# Patient Record
Sex: Male | Born: 1975 | Race: White | Hispanic: No | Marital: Married | Smoking: Former smoker
Health system: Southern US, Community
[De-identification: ages and names within clinical notes are randomized; demographics above are authoritative.]

## PROBLEM LIST (undated history)

## (undated) DIAGNOSIS — N289 Disorder of kidney and ureter, unspecified: Secondary | ICD-10-CM

## (undated) DIAGNOSIS — K259 Gastric ulcer, unspecified as acute or chronic, without hemorrhage or perforation: Secondary | ICD-10-CM

## (undated) DIAGNOSIS — K219 Gastro-esophageal reflux disease without esophagitis: Secondary | ICD-10-CM

## (undated) DIAGNOSIS — Z87442 Personal history of urinary calculi: Secondary | ICD-10-CM

## (undated) DIAGNOSIS — N200 Calculus of kidney: Secondary | ICD-10-CM

## (undated) HISTORY — DX: Calculus of kidney: N20.0

## (undated) HISTORY — PX: APPENDECTOMY: SHX54

## (undated) HISTORY — PX: OTHER SURGICAL HISTORY: SHX169

## (undated) HISTORY — PX: CYSTOSCOPY W/ URETERAL STENT PLACEMENT: SHX1429

## (undated) HISTORY — PX: SHOULDER SURGERY: SHX246

---

## 2006-07-21 HISTORY — PX: SHOULDER SURGERY: SHX246

## 2020-01-02 ENCOUNTER — Emergency Department (HOSPITAL_COMMUNITY): Payer: Self-pay

## 2020-01-02 ENCOUNTER — Encounter (HOSPITAL_COMMUNITY): Payer: Self-pay | Admitting: *Deleted

## 2020-01-02 ENCOUNTER — Emergency Department (HOSPITAL_COMMUNITY)
Admission: EM | Admit: 2020-01-02 | Discharge: 2020-01-02 | Disposition: A | Discharge status: Home / Self Care | Payer: Self-pay | Attending: Emergency Medicine | Admitting: Emergency Medicine

## 2020-01-02 ENCOUNTER — Other Ambulatory Visit: Payer: Self-pay

## 2020-01-02 DIAGNOSIS — R197 Diarrhea, unspecified: Secondary | ICD-10-CM | POA: Insufficient documentation

## 2020-01-02 DIAGNOSIS — R1011 Right upper quadrant pain: Secondary | ICD-10-CM

## 2020-01-02 DIAGNOSIS — R10816 Epigastric abdominal tenderness: Secondary | ICD-10-CM | POA: Insufficient documentation

## 2020-01-02 DIAGNOSIS — R10811 Right upper quadrant abdominal tenderness: Secondary | ICD-10-CM | POA: Insufficient documentation

## 2020-01-02 DIAGNOSIS — R112 Nausea with vomiting, unspecified: Secondary | ICD-10-CM | POA: Insufficient documentation

## 2020-01-02 LAB — CBC WITH DIFFERENTIAL/PLATELET
Abs Immature Granulocytes: 0.01 10*3/uL (ref 0.00–0.07)
Basophils Absolute: 0.1 10*3/uL (ref 0.0–0.1)
Basophils Relative: 1 %
Eosinophils Absolute: 0.5 10*3/uL (ref 0.0–0.5)
Eosinophils Relative: 8 %
HCT: 48.3 % (ref 39.0–52.0)
Hemoglobin: 16.7 g/dL (ref 13.0–17.0)
Immature Granulocytes: 0 %
Lymphocytes Relative: 30 %
Lymphs Abs: 1.7 10*3/uL (ref 0.7–4.0)
MCH: 29.8 pg (ref 26.0–34.0)
MCHC: 34.6 g/dL (ref 30.0–36.0)
MCV: 86.3 fL (ref 80.0–100.0)
Monocytes Absolute: 0.5 10*3/uL (ref 0.1–1.0)
Monocytes Relative: 8 %
Neutro Abs: 3 10*3/uL (ref 1.7–7.7)
Neutrophils Relative %: 53 %
Platelets: 203 10*3/uL (ref 150–400)
RBC: 5.6 MIL/uL (ref 4.22–5.81)
RDW: 12 % (ref 11.5–15.5)
WBC: 5.7 10*3/uL (ref 4.0–10.5)
nRBC: 0 % (ref 0.0–0.2)

## 2020-01-02 LAB — COMPREHENSIVE METABOLIC PANEL
ALT: 23 U/L (ref 0–44)
AST: 25 U/L (ref 15–41)
Albumin: 4.3 g/dL (ref 3.5–5.0)
Alkaline Phosphatase: 68 U/L (ref 38–126)
Anion gap: 11 (ref 5–15)
BUN: 12 mg/dL (ref 6–20)
CO2: 23 mmol/L (ref 22–32)
Calcium: 9.3 mg/dL (ref 8.9–10.3)
Chloride: 104 mmol/L (ref 98–111)
Creatinine, Ser: 0.94 mg/dL (ref 0.61–1.24)
GFR calc Af Amer: >60 mL/min (ref >60)
GFR calc non Af Amer: >60 mL/min (ref >60)
Glucose, Bld: 145 mg/dL — ABNORMAL HIGH (ref 70–99)
Potassium: 3.9 mmol/L (ref 3.5–5.1)
Sodium: 138 mmol/L (ref 135–145)
Total Bilirubin: 0.5 mg/dL (ref 0.3–1.2)
Total Protein: 7.4 g/dL (ref 6.5–8.1)

## 2020-01-02 LAB — LIPASE, BLOOD: Lipase: 32 U/L (ref 11–51)

## 2020-01-02 MED ORDER — ONDANSETRON HCL 4 MG/2ML IJ SOLN
4.0000 mg | Freq: Once | INTRAMUSCULAR | Status: AC
  Administered 2020-01-02: 4 mg via INTRAVENOUS
  Filled 2020-01-02: qty 2

## 2020-01-02 MED ORDER — HALOPERIDOL LACTATE 5 MG/ML IJ SOLN
5.0000 mg | Freq: Once | INTRAMUSCULAR | Status: AC
  Administered 2020-01-02: 5 mg via INTRAVENOUS
  Filled 2020-01-02: qty 1

## 2020-01-02 MED ORDER — ONDANSETRON 8 MG PO TBDP
8.0000 mg | ORAL_TABLET | Freq: Three times a day (TID) | ORAL | 0 refills | Status: DC | PRN
Start: 2020-01-02 — End: 2022-01-13

## 2020-01-02 MED ORDER — MORPHINE SULFATE (PF) 4 MG/ML IV SOLN: 8.0000 mg | Freq: Every evening | INTRAVENOUS | Status: DC | PRN

## 2020-01-02 MED ORDER — PROMETHAZINE HCL 25 MG RE SUPP
25.0000 mg | Freq: Four times a day (QID) | RECTAL | 0 refills | Status: DC | PRN
Start: 2020-01-02 — End: 2022-01-13

## 2020-01-02 MED ORDER — MORPHINE SULFATE (PF) 4 MG/ML IV SOLN
4.0000 mg | Freq: Once | INTRAVENOUS | Status: AC
  Administered 2020-01-02: 4 mg via INTRAVENOUS
  Filled 2020-01-02: qty 1

## 2020-01-02 MED ORDER — IOHEXOL 300 MG/ML  SOLN
100.0000 mL | Freq: Once | INTRAMUSCULAR | Status: AC | PRN
  Administered 2020-01-02: 100 mL via INTRAVENOUS

## 2020-01-02 NOTE — ED Triage Notes (Signed)
Pt c/o abd pain with n /v/d, chills, fever that started last night,

## 2020-01-02 NOTE — Discharge Instructions (Signed)
We saw you in the ER for the abdominal pain. All of our results are normal, including all labs and imaging. Kidney function is fine as well. We are not sure what is causing your abdominal pain, and recommend that you see your primary care doctor within 2-3 days for further evaluation. If your symptoms get worse, return to the ER. Take the pain meds and nausea meds as prescribed.   

## 2020-01-02 NOTE — ED Provider Notes (Signed)
[facility] EMERGENCY DEPARTMENT Provider Note   CSN: [id number] Arrival date & time: 01/02/20  3762     History Chief Complaint  Patient presents with  . Abdominal Pain    George Newton is a 44 y.o. male.  HPI    44 year old male comes in a chief complaint of abdominal pain. Patient reports that he started having his abdominal pain at midnight.  His pain is generalized.  He described as sharp pain that is constant and severe.  Pain is nonradiating.  Patient has associated nausea with 10+ episodes of emesis, bilious in nature.  He also had 2 or 3 loose bowel movements.  Patient has history of appendectomy.  He denies any history of similar symptoms.  Denies any history of heavy alcohol use, marijuana use or history of gallstones.  History reviewed. No pertinent past medical history.  There are no problems to display for this patient.   Past Surgical History:  Procedure Laterality Date  . APPENDECTOMY    . SHOULDER SURGERY     left shoulder       History reviewed. No pertinent family history.  Social History   Tobacco Use  . Smoking status: Never Smoker  . Smokeless tobacco: Current User    Types: Chew  Substance Use Topics  . Alcohol use: Not Currently  . Drug use: Never    Home Medications Prior to Admission medications   Medication Sig Start Date End Date Taking? Authorizing Provider  ondansetron (ZOFRAN ODT) 8 MG disintegrating tablet Take 1 tablet (8 mg total) by mouth every 8 (eight) hours as needed for nausea. 01/02/20   Derwood Kaplan, MD  promethazine (PHENERGAN) 25 MG suppository Place 1 suppository (25 mg total) rectally every 6 (six) hours as needed for nausea or vomiting. 01/02/20   Derwood Kaplan, MD    Allergies    Patient has no known allergies.  Review of Systems   Review of Systems  Constitutional: Positive for activity change.  Respiratory: Negative for shortness of breath.   Cardiovascular: Negative for chest pain.  Gastrointestinal:  Positive for abdominal pain, diarrhea, nausea and vomiting.  Genitourinary: Negative for dysuria and flank pain.  All other systems reviewed and are negative.   Physical Exam Updated Vital Signs BP (!) 187/111   Pulse 63   Temp 97.8 F (36.6 C) (Oral)   Resp (!) 21   Ht 6' (1.829 m)   Wt 90.7 kg   SpO2 100%   BMI 27.12 kg/m   Physical Exam Vitals and nursing note reviewed.  Constitutional:      Appearance: He is well-developed.  HENT:     Head: Normocephalic and atraumatic.  Eyes:     Conjunctiva/sclera: Conjunctivae normal.     Pupils: Pupils are equal, round, and reactive to light.  Cardiovascular:     Rate and Rhythm: Normal rate and regular rhythm.  Pulmonary:     Effort: Pulmonary effort is normal.     Breath sounds: Normal breath sounds.  Abdominal:     General: Bowel sounds are normal. There is no distension.     Palpations: Abdomen is soft. There is no mass.     Tenderness: There is abdominal tenderness in the right upper quadrant and epigastric area. There is guarding. There is no rebound. Positive signs include Murphy's sign.  Musculoskeletal:        General: No deformity.     Cervical back: Normal range of motion and neck supple.  Skin:    General: Skin  is warm.  Neurological:     Mental Status: He is alert and oriented to person, place, and time.     ED Results / Procedures / Treatments   Labs (all labs ordered are listed, but only abnormal results are displayed) Labs Reviewed  COMPREHENSIVE METABOLIC PANEL - Abnormal; Notable for the following components:      Result Value   Glucose, Bld 145 (*)    All other components within normal limits  CBC WITH DIFFERENTIAL/PLATELET  LIPASE, BLOOD    EKG None  Radiology CT ABDOMEN PELVIS W CONTRAST  Result Date: 01/02/2020 CLINICAL DATA:  Abdominal pain, fever.  Previous appendectomy. EXAM: CT ABDOMEN AND PELVIS WITH CONTRAST TECHNIQUE: Multidetector CT imaging of the abdomen and pelvis was performed  using the standard protocol following bolus administration of intravenous contrast. CONTRAST:  OMNIPAQUE IOHEXOL 300 MG/ML  SOLN COMPARISON:  None. FINDINGS: Lower chest: No acute abnormality. Hepatobiliary: No focal liver abnormality is seen. No gallstones, gallbladder wall thickening, or biliary dilatation. Pancreas: Unremarkable. No pancreatic ductal dilatation or surrounding inflammatory changes. Spleen: Normal in size without focal abnormality. Adrenals/Urinary Tract: Adrenal glands are unremarkable. Kidneys are normal, without renal calculi, focal lesion, or hydronephrosis. Bladder is unremarkable. Stomach/Bowel: Stomach and small bowel decompressed. Appendix surgically absent. The colon is nondilated, unremarkable. Vascular/Lymphatic: No significant vascular findings are present. No enlarged abdominal or pelvic lymph nodes. Reproductive: Prostate is unremarkable. Other: No ascites.  No free air. Musculoskeletal: No acute or significant osseous findings. IMPRESSION: No acute findings. Electronically Signed   By: Corlis Leak M.D.   On: 01/02/2020 09:55   US Abdomen Limited RUQ  Result Date: 01/02/2020 CLINICAL DATA:  Abdominal pain, fever and chills. EXAM: ULTRASOUND ABDOMEN LIMITED RIGHT UPPER QUADRANT COMPARISON:  None. FINDINGS: Gallbladder: No gallstones or wall thickening visualized. No sonographic Murphy sign noted by sonographer. Common bile duct: Diameter: 3 mm, within normal limits. Liver: Mildly increased in echogenicity diffusely. No focal lesions. Portal vein is patent on color Doppler imaging with normal direction of blood flow towards the liver. Other: None. IMPRESSION: 1. No acute findings. 2. Mild hepatic steatosis. Electronically Signed   By: Leanna Battles M.D.   On: 01/02/2020 08:49    Procedures Procedures (including critical care time)  Medications Ordered in ED Medications  morphine 4 MG/ML injection 8 mg (has no administration in time range)  ondansetron (ZOFRAN)  injection 4 mg (4 mg Intravenous Given 01/02/20 0711)  morphine 4 MG/ML injection 4 mg (4 mg Intravenous Given 01/02/20 0827)  haloperidol lactate (HALDOL) injection 5 mg (5 mg Intravenous Given 01/02/20 1017)  iohexol (OMNIPAQUE) 300 MG/ML solution 100 mL (100 mLs Intravenous Contrast Given 01/02/20 9470)    ED Course  I have reviewed the triage vital signs and the nursing notes.  Pertinent labs & imaging results that were available during my care of the patient were reviewed by me and considered in my medical decision making (see chart for details).  Clinical Course as of Jan 02 1128  Mon Jan 02, 2020  1128 Upon reassessment patient had continued pain therefore decision was made to get a CT scan around 9:30 AM. CT scan results are reassuring.  Patient has now passed oral challenge.  He is feeling better.  Stable for discharge.   [AN]    Clinical Course User Index [AN] Derwood Kaplan, MD   MDM Rules/Calculators/A&P  DDx includes: Pancreatitis Hepatobiliary pathology including cholecystitis Gastritis/PUD SBO ACS syndrome Aortic Dissection Colitis Gastroparesis  44 year old comes in a chief complaint of abdominal pain.  Patient is noted to have generalized abdominal tenderness that is worst over the epigastric and right upper quadrant.  It appears to be primarily GI issue.  We will start with basic labs, ultrasound and symptom control.   Final Clinical Impression(s) / ED Diagnoses Final diagnoses:  Abdominal pain, RUQ  Nausea vomiting and diarrhea    Rx / DC Orders ED Discharge Orders         Ordered    promethazine (PHENERGAN) 25 MG suppository  Every 6 hours PRN     Discontinue  Reprint     01/02/20 1127    ondansetron (ZOFRAN ODT) 8 MG disintegrating tablet  Every 8 hours PRN     Discontinue  Reprint     01/02/20 [place], Teigan Manner, MD 01/02/20 1129

## 2020-01-07 ENCOUNTER — Other Ambulatory Visit: Payer: Self-pay

## 2020-01-07 ENCOUNTER — Encounter (HOSPITAL_COMMUNITY): Payer: Self-pay | Admitting: Emergency Medicine

## 2020-01-07 ENCOUNTER — Emergency Department (HOSPITAL_COMMUNITY)
Admission: EM | Admit: 2020-01-07 | Discharge: 2020-01-07 | Disposition: A | Discharge status: Home / Self Care | Payer: Self-pay | Attending: Emergency Medicine | Admitting: Emergency Medicine

## 2020-01-07 DIAGNOSIS — E871 Hypo-osmolality and hyponatremia: Secondary | ICD-10-CM | POA: Insufficient documentation

## 2020-01-07 DIAGNOSIS — E876 Hypokalemia: Secondary | ICD-10-CM | POA: Insufficient documentation

## 2020-01-07 DIAGNOSIS — R748 Abnormal levels of other serum enzymes: Secondary | ICD-10-CM | POA: Insufficient documentation

## 2020-01-07 DIAGNOSIS — R112 Nausea with vomiting, unspecified: Secondary | ICD-10-CM | POA: Insufficient documentation

## 2020-01-07 DIAGNOSIS — R1010 Upper abdominal pain, unspecified: Secondary | ICD-10-CM | POA: Insufficient documentation

## 2020-01-07 LAB — CBC WITH DIFFERENTIAL/PLATELET
Abs Immature Granulocytes: 0.06 10*3/uL (ref 0.00–0.07)
Basophils Absolute: 0 10*3/uL (ref 0.0–0.1)
Basophils Relative: 0 %
Eosinophils Absolute: 0.1 10*3/uL (ref 0.0–0.5)
Eosinophils Relative: 1 %
HCT: 53.2 % — ABNORMAL HIGH (ref 39.0–52.0)
Hemoglobin: 19.1 g/dL — ABNORMAL HIGH (ref 13.0–17.0)
Immature Granulocytes: 1 %
Lymphocytes Relative: 22 %
Lymphs Abs: 1.9 10*3/uL (ref 0.7–4.0)
MCH: 29.6 pg (ref 26.0–34.0)
MCHC: 35.9 g/dL (ref 30.0–36.0)
MCV: 82.4 fL (ref 80.0–100.0)
Monocytes Absolute: 0.7 10*3/uL (ref 0.1–1.0)
Monocytes Relative: 9 %
Neutro Abs: 5.8 10*3/uL (ref 1.7–7.7)
Neutrophils Relative %: 67 %
Platelets: 295 10*3/uL (ref 150–400)
RBC: 6.46 MIL/uL — ABNORMAL HIGH (ref 4.22–5.81)
RDW: 12.4 % (ref 11.5–15.5)
WBC: 8.5 10*3/uL (ref 4.0–10.5)
nRBC: 0 % (ref 0.0–0.2)

## 2020-01-07 LAB — COMPREHENSIVE METABOLIC PANEL
ALT: 21 U/L (ref 0–44)
AST: 21 U/L (ref 15–41)
Albumin: 4.7 g/dL (ref 3.5–5.0)
Alkaline Phosphatase: 70 U/L (ref 38–126)
Anion gap: 14 (ref 5–15)
BUN: 22 mg/dL — ABNORMAL HIGH (ref 6–20)
CO2: 22 mmol/L (ref 22–32)
Calcium: 10.1 mg/dL (ref 8.9–10.3)
Chloride: 96 mmol/L — ABNORMAL LOW (ref 98–111)
Creatinine, Ser: 1.22 mg/dL (ref 0.61–1.24)
GFR calc Af Amer: >60 mL/min (ref >60)
GFR calc non Af Amer: >60 mL/min (ref >60)
Glucose, Bld: 148 mg/dL — ABNORMAL HIGH (ref 70–99)
Potassium: 3.1 mmol/L — ABNORMAL LOW (ref 3.5–5.1)
Sodium: 132 mmol/L — ABNORMAL LOW (ref 135–145)
Total Bilirubin: 2.2 mg/dL — ABNORMAL HIGH (ref 0.3–1.2)
Total Protein: 8.4 g/dL — ABNORMAL HIGH (ref 6.5–8.1)

## 2020-01-07 LAB — LIPASE, BLOOD: Lipase: 130 U/L — ABNORMAL HIGH (ref 11–51)

## 2020-01-07 MED ORDER — METOCLOPRAMIDE HCL 10 MG PO TABS
10.0000 mg | ORAL_TABLET | Freq: Four times a day (QID) | ORAL | 0 refills | Status: DC | PRN
Start: 2020-01-07 — End: 2022-01-13

## 2020-01-07 MED ORDER — SODIUM CHLORIDE 0.9 % IV BOLUS
1000.0000 mL | Freq: Once | INTRAVENOUS | Status: AC
  Administered 2020-01-07: 1000 mL via INTRAVENOUS

## 2020-01-07 MED ORDER — ALUM & MAG HYDROXIDE-SIMETH 200-200-20 MG/5ML PO SUSP
30.0000 mL | Freq: Once | ORAL | Status: AC
  Administered 2020-01-07: 30 mL via ORAL
  Filled 2020-01-07: qty 30

## 2020-01-07 MED ORDER — MORPHINE SULFATE (PF) 4 MG/ML IV SOLN
4.0000 mg | Freq: Once | INTRAVENOUS | Status: AC
  Administered 2020-01-07: 4 mg via INTRAVENOUS
  Filled 2020-01-07: qty 1

## 2020-01-07 MED ORDER — LIDOCAINE VISCOUS HCL 2 % MT SOLN
15.0000 mL | Freq: Once | OROMUCOSAL | Status: AC
  Administered 2020-01-07: 15 mL via ORAL
  Filled 2020-01-07: qty 15

## 2020-01-07 MED ORDER — PANTOPRAZOLE SODIUM 40 MG PO TBEC
40.0000 mg | DELAYED_RELEASE_TABLET | Freq: Once | ORAL | Status: AC
  Administered 2020-01-07: 40 mg via ORAL
  Filled 2020-01-07: qty 1

## 2020-01-07 MED ORDER — METOCLOPRAMIDE HCL 5 MG/ML IJ SOLN
10.0000 mg | Freq: Once | INTRAMUSCULAR | Status: AC
  Administered 2020-01-07: 10 mg via INTRAVENOUS
  Filled 2020-01-07: qty 2

## 2020-01-07 NOTE — ED Provider Notes (Signed)
[facility] EMERGENCY DEPARTMENT Provider Note   CSN: [id number] Arrival date & time: 01/07/20  0426   History Chief Complaint  Patient presents with  . Emesis    George Newton is a 44 y.o. male.  The history is provided by the patient.  Emesis He has no significant past history and comes in complaining of ongoing abdominal pain and vomiting.  He had been seen in the ED 5 days ago with nausea and vomiting and was sent home with prescriptions for ondansetron and promethazine suppository.  At that time, CT of abdomen and ultrasound of abdomen were unremarkable.  He did not get the promethazine suppository prescription filled.  Since then, he has had ongoing problems with nausea and vomiting.  He had diarrhea as part of his initial presentation, but has not had diarrhea since going home from the hospital.  Ondansetron gives relief only for about 1 hour.  Pain is burning and in the periumbilical area with some radiation to the back.  It is rated at 8/10.  It does not improve after emesis.  He denies tobacco use, ethanol use.  History reviewed. No pertinent past medical history.  There are no problems to display for this patient.   Past Surgical History:  Procedure Laterality Date  . APPENDECTOMY    . SHOULDER SURGERY     left shoulder       History reviewed. No pertinent family history.  Social History   Tobacco Use  . Smoking status: Never Smoker  . Smokeless tobacco: Current User    Types: Chew  Substance Use Topics  . Alcohol use: Not Currently  . Drug use: Yes    Types: Marijuana    Home Medications Prior to Admission medications   Medication Sig Start Date End Date Taking? Authorizing Provider  ondansetron (ZOFRAN ODT) 8 MG disintegrating tablet Take 1 tablet (8 mg total) by mouth every 8 (eight) hours as needed for nausea. 01/02/20   Derwood Kaplan, MD  promethazine (PHENERGAN) 25 MG suppository Place 1 suppository (25 mg total) rectally every 6 (six) hours as  needed for nausea or vomiting. 01/02/20   Derwood Kaplan, MD    Allergies    Patient has no known allergies.  Review of Systems   Review of Systems  Gastrointestinal: Positive for vomiting.  All other systems reviewed and are negative.   Physical Exam Updated Vital Signs BP (!) 160/113 (BP Location: Right Arm)   Pulse 89   Temp 98.1 F (36.7 C) (Oral)   Resp 18   Ht 6' (1.829 m)   Wt 85.7 kg   SpO2 100%   BMI 25.63 kg/m   Physical Exam Vitals and nursing note reviewed.   44 year old male, appears to be in pain and very uncomfortable, but is in no acute distress. Vital signs are significant for elevated blood pressure. Oxygen saturation is 100%, which is normal. Head is normocephalic and atraumatic. PERRLA, EOMI. Oropharynx is clear. Neck is nontender and supple without adenopathy or JVD. Back is nontender and there is no CVA tenderness. Lungs are clear without rales, wheezes, or rhonchi. Chest is nontender. Heart has regular rate and rhythm without murmur. Abdomen is soft, flat, with mild tenderness diffusely.  There is no rebound or guarding.  There are no masses or hepatosplenomegaly and peristalsis is hypoactive. Extremities have no cyanosis or edema, full range of motion is present. Skin is warm and dry without rash. Neurologic: Mental status is normal, cranial nerves are intact, there are  no motor or sensory deficits.  ED Results / Procedures / Treatments   Labs (all labs ordered are listed, but only abnormal results are displayed) Labs Reviewed  COMPREHENSIVE METABOLIC PANEL - Abnormal; Notable for the following components:      Result Value   Sodium 132 (*)    Potassium 3.1 (*)    Chloride 96 (*)    Glucose, Bld 148 (*)    BUN 22 (*)    Total Protein 8.4 (*)    Total Bilirubin 2.2 (*)    All other components within normal limits  LIPASE, BLOOD - Abnormal; Notable for the following components:   Lipase 130 (*)    All other components within normal limits    CBC WITH DIFFERENTIAL/PLATELET - Abnormal; Notable for the following components:   RBC 6.46 (*)    Hemoglobin 19.1 (*)    HCT 53.2 (*)    All other components within normal limits   Procedures Procedures   Medications Ordered in ED Medications  sodium chloride 0.9 % bolus 1,000 mL (0 mLs Intravenous Stopped 01/07/20 0538)  metoCLOPramide (REGLAN) injection 10 mg (10 mg Intravenous Given 01/07/20 0504)  morphine 4 MG/ML injection 4 mg (4 mg Intravenous Given 01/07/20 0506)  alum & mag hydroxide-simeth (MAALOX/MYLANTA) 200-200-20 MG/5ML suspension 30 mL (30 mLs Oral Given 01/07/20 0614)    And  lidocaine (XYLOCAINE) 2 % viscous mouth solution 15 mL (15 mLs Oral Given 01/07/20 0614)  pantoprazole (PROTONIX) EC tablet 40 mg (40 mg Oral Given 01/07/20 0614)  sodium chloride 0.9 % bolus 1,000 mL (1,000 mLs Intravenous New Bag/Given 01/07/20 5916)    ED Course  I have reviewed the triage vital signs and the nursing notes.  Pertinent lab results that were available during my care of the patient were reviewed by me and considered in my medical decision making (see chart for details).  MDM Rules/Calculators/A&P Ongoing abdominal pain and vomiting, etiology unclear.  Old records are reviewed confirming essentially normal CT scan of abdomen and pelvis, right upper quadrant ultrasound.  At this point, I wonder if his symptoms could be related to peptic ulcer disease.  Doubt bowel obstruction, pancreatitis, diverticulitis in light of recent imaging.  He will be given IV fluids, metoclopramide and a small dose of morphine.  Screening labs are repeated.  5:54 AM Labs show significant increase in lipase consistent with acute pancreatitis.  Bilirubin is also now elevated with normal transaminases and alkaline phosphatase, question Gilbert's disease.  Creatinine and BUN have risen consistent with dehydration.  Mild hyponatremia is present felt to be secondary to vomiting and not clinically significant.   Hypokalemia is noted felt to be secondary to vomiting.  Hemoglobin has increased, also consistent with dehydration.  He had significant relief with above-noted treatment but still notices burning in his abdomen.  Will give GI cocktail and pantoprazole as well as additional IV fluid.  6:50 AM He noted significant further improvement with GI cocktail.  He will be discharged with prescription for metoclopramide and told to buy over-the-counter omeprazole.  Return precautions discussed.  Final Clinical Impression(s) / ED Diagnoses Final diagnoses:  Upper abdominal pain  Non-intractable vomiting with nausea, unspecified vomiting type  Elevated lipase  Hypokalemia  Hyponatremia    Rx / DC Orders ED Discharge Orders         Ordered    metoCLOPramide (REGLAN) 10 MG tablet  Every 6 hours PRN     Discontinue  Reprint     01/07/20 0645  Dione Booze, MD 01/07/20 [phone]

## 2020-01-07 NOTE — Discharge Instructions (Signed)
Take omeprazole (Prilosec OTC) every day.  Take antacids as needed.  Return if your symptoms are getting worse.

## 2020-01-07 NOTE — ED Triage Notes (Signed)
Patient states nausea and vomiting, abdominal pain x 1 week. Patient states that the Zofran he was prescribed is not working.

## 2020-07-22 ENCOUNTER — Encounter (HOSPITAL_COMMUNITY): Payer: Self-pay | Admitting: Emergency Medicine

## 2020-07-22 ENCOUNTER — Other Ambulatory Visit: Payer: Self-pay

## 2020-07-22 ENCOUNTER — Emergency Department (HOSPITAL_COMMUNITY): Payer: Self-pay

## 2020-07-22 ENCOUNTER — Emergency Department (HOSPITAL_COMMUNITY)
Admission: EM | Admit: 2020-07-22 | Discharge: 2020-07-22 | Disposition: A | Discharge status: Home / Self Care | Payer: Self-pay | Attending: Emergency Medicine | Admitting: Emergency Medicine

## 2020-07-22 DIAGNOSIS — R1013 Epigastric pain: Secondary | ICD-10-CM | POA: Insufficient documentation

## 2020-07-22 DIAGNOSIS — F1590 Other stimulant use, unspecified, uncomplicated: Secondary | ICD-10-CM | POA: Insufficient documentation

## 2020-07-22 DIAGNOSIS — K625 Hemorrhage of anus and rectum: Secondary | ICD-10-CM | POA: Insufficient documentation

## 2020-07-22 HISTORY — DX: Disorder of kidney and ureter, unspecified: N28.9

## 2020-07-22 LAB — CBC WITH DIFFERENTIAL/PLATELET
Abs Immature Granulocytes: 0.02 10*3/uL (ref 0.00–0.07)
Basophils Absolute: 0 10*3/uL (ref 0.0–0.1)
Basophils Relative: 1 %
Eosinophils Absolute: 0.2 10*3/uL (ref 0.0–0.5)
Eosinophils Relative: 2 %
HCT: 46.4 % (ref 39.0–52.0)
Hemoglobin: 16.4 g/dL (ref 13.0–17.0)
Immature Granulocytes: 0 %
Lymphocytes Relative: 23 %
Lymphs Abs: 2 10*3/uL (ref 0.7–4.0)
MCH: 30.4 pg (ref 26.0–34.0)
MCHC: 35.3 g/dL (ref 30.0–36.0)
MCV: 85.9 fL (ref 80.0–100.0)
Monocytes Absolute: 0.6 10*3/uL (ref 0.1–1.0)
Monocytes Relative: 6 %
Neutro Abs: 6 10*3/uL (ref 1.7–7.7)
Neutrophils Relative %: 68 %
Platelets: 259 10*3/uL (ref 150–400)
RBC: 5.4 MIL/uL (ref 4.22–5.81)
RDW: 11.7 % (ref 11.5–15.5)
WBC: 8.8 10*3/uL (ref 4.0–10.5)
nRBC: 0 % (ref 0.0–0.2)

## 2020-07-22 LAB — COMPREHENSIVE METABOLIC PANEL
ALT: 14 U/L (ref 0–44)
AST: 15 U/L (ref 15–41)
Albumin: 4.2 g/dL (ref 3.5–5.0)
Alkaline Phosphatase: 54 U/L (ref 38–126)
Anion gap: 6 (ref 5–15)
BUN: 11 mg/dL (ref 6–20)
CO2: 27 mmol/L (ref 22–32)
Calcium: 8.9 mg/dL (ref 8.9–10.3)
Chloride: 98 mmol/L (ref 98–111)
Creatinine, Ser: 1.11 mg/dL (ref 0.61–1.24)
GFR, Estimated: >60 mL/min (ref >60)
Glucose, Bld: 100 mg/dL — ABNORMAL HIGH (ref 70–99)
Potassium: 3.2 mmol/L — ABNORMAL LOW (ref 3.5–5.1)
Sodium: 131 mmol/L — ABNORMAL LOW (ref 135–145)
Total Bilirubin: 1.3 mg/dL — ABNORMAL HIGH (ref 0.3–1.2)
Total Protein: 6.9 g/dL (ref 6.5–8.1)

## 2020-07-22 MED ORDER — DICYCLOMINE HCL 20 MG PO TABS
ORAL_TABLET | ORAL | 0 refills | Status: DC
Start: 2020-07-22 — End: 2020-07-22

## 2020-07-22 MED ORDER — PANTOPRAZOLE SODIUM 40 MG IV SOLR
40.0000 mg | Freq: Once | INTRAVENOUS | Status: AC
  Administered 2020-07-22: 40 mg via INTRAVENOUS
  Filled 2020-07-22: qty 40

## 2020-07-22 MED ORDER — PANTOPRAZOLE SODIUM 20 MG PO TBEC: 20.0000 mg | DELAYED_RELEASE_TABLET | Freq: Every day | ORAL | 0 refills | Status: DC

## 2020-07-22 MED ORDER — SODIUM CHLORIDE 0.9 % IV BOLUS
1000.0000 mL | Freq: Once | INTRAVENOUS | Status: AC
  Administered 2020-07-22: 1000 mL via INTRAVENOUS

## 2020-07-22 MED ORDER — DICYCLOMINE HCL 20 MG PO TABS
ORAL_TABLET | ORAL | 0 refills | Status: DC
Start: 2020-07-22 — End: 2022-01-13

## 2020-07-22 MED ORDER — PROMETHAZINE HCL 25 MG/ML IJ SOLN
12.5000 mg | Freq: Once | INTRAMUSCULAR | Status: AC
  Administered 2020-07-22: 12.5 mg via INTRAVENOUS
  Filled 2020-07-22: qty 1

## 2020-07-22 MED ORDER — HYDROMORPHONE HCL 1 MG/ML IJ SOLN
0.5000 mg | Freq: Once | INTRAMUSCULAR | Status: AC
  Administered 2020-07-22: 0.5 mg via INTRAVENOUS
  Filled 2020-07-22: qty 1

## 2020-07-22 MED ORDER — ONDANSETRON HCL 4 MG/2ML IJ SOLN
4.0000 mg | Freq: Once | INTRAMUSCULAR | Status: DC
  Filled 2020-07-22: qty 2

## 2020-07-22 MED ORDER — IOHEXOL 300 MG/ML  SOLN
100.0000 mL | Freq: Once | INTRAMUSCULAR | Status: AC | PRN
  Administered 2020-07-22: 100 mL via INTRAVENOUS

## 2020-07-22 NOTE — ED Triage Notes (Signed)
Pt c/o of Left flank sided abdominal pain with blood stools x 2 days

## 2020-07-22 NOTE — ED Provider Notes (Signed)
[facility] EMERGENCY DEPARTMENT Provider Note   CSN: [id number] Arrival date & time: 07/22/20  0801     History Chief Complaint  Patient presents with  . Abdominal Pain  . Rectal Bleeding    George Newton is a 45 y.o. male.  Patient complains of abdominal pain rectal bleeding.  Patient has a history of peptic ulcers.  Not throwing up any blood or vomiting at all  The history is provided by the patient and medical records. No language interpreter was used.  Abdominal Pain Pain location:  Epigastric Pain quality: aching   Pain radiates to:  Does not radiate Pain severity:  Moderate Onset quality:  Sudden Timing:  Constant Progression:  Waxing and waning Chronicity:  New Context: not alcohol use   Relieved by:  Nothing Worsened by:  Nothing Ineffective treatments:  None tried Associated symptoms: hematochezia   Associated symptoms: no chest pain, no cough, no diarrhea, no fatigue and no hematuria   Rectal Bleeding Associated symptoms: abdominal pain        Past Medical History:  Diagnosis Date  . Renal disorder     There are no problems to display for this patient.   Past Surgical History:  Procedure Laterality Date  . APPENDECTOMY    . SHOULDER SURGERY     left shoulder       History reviewed. No pertinent family history.  Social History   Tobacco Use  . Smoking status: Never Smoker  . Smokeless tobacco: Current User    Types: Chew  Substance Use Topics  . Alcohol use: Not Currently  . Drug use: Yes    Types: Marijuana    Home Medications Prior to Admission medications   Medication Sig Start Date End Date Taking? Authorizing Provider  dicyclomine (BENTYL) 20 MG tablet Take one every 8 hours if needed for abdominal cramps 07/22/20  Yes Bethann Berkshire, MD  pantoprazole (PROTONIX) 20 MG tablet Take 1 tablet (20 mg total) by mouth daily. 07/22/20  Yes Bethann Berkshire, MD  metoCLOPramide (REGLAN) 10 MG tablet Take 1 tablet (10 mg total) by mouth every 6  (six) hours as needed for nausea (or headache). Patient not taking: Reported on 07/22/2020 01/07/20   Dione Booze, MD  ondansetron [facility] ODT) 8 MG disintegrating tablet Take 1 tablet (8 mg total) by mouth every 8 (eight) hours as needed for nausea. Patient not taking: Reported on 07/22/2020 01/02/20   Derwood Kaplan, MD  promethazine (PHENERGAN) 25 MG suppository Place 1 suppository (25 mg total) rectally every 6 (six) hours as needed for nausea or vomiting. Patient not taking: Reported on 07/22/2020 01/02/20   Derwood Kaplan, MD    Allergies    Penicillins  Review of Systems   Review of Systems  Constitutional: Negative for appetite change and fatigue.  HENT: Negative for congestion, ear discharge and sinus pressure.   Eyes: Negative for discharge.  Respiratory: Negative for cough.   Cardiovascular: Negative for chest pain.  Gastrointestinal: Positive for abdominal pain and hematochezia. Negative for diarrhea.       Rectal bleeding  Genitourinary: Negative for frequency and hematuria.  Musculoskeletal: Negative for back pain.  Skin: Negative for rash.  Neurological: Negative for seizures and headaches.  Psychiatric/Behavioral: Negative for hallucinations.    Physical Exam Updated Vital Signs BP (!) 143/108 (BP Location: Right Arm)   Pulse 79   Temp 98.6 F (37 C) (Oral)   Resp 18   Ht 6' (1.829 m)   Wt 90.7 kg   SpO2  98%   BMI 27.12 kg/m   Physical Exam Vitals and nursing note reviewed.  Constitutional:      Appearance: He is well-developed.  HENT:     Head: Normocephalic.     Nose: Nose normal.  Eyes:     General: No scleral icterus.    Extraocular Movements: EOM normal.     Conjunctiva/sclera: Conjunctivae normal.  Neck:     Thyroid: No thyromegaly.  Cardiovascular:     Rate and Rhythm: Normal rate and regular rhythm.     Heart sounds: No murmur heard. No friction rub. No gallop.   Pulmonary:     Breath sounds: No stridor. No wheezing or rales.  Chest:      Chest wall: No tenderness.  Abdominal:     General: There is no distension.     Tenderness: There is abdominal tenderness. There is no rebound.  Genitourinary:    Comments: Rectal exam normal no blood seen Musculoskeletal:        General: No edema. Normal range of motion.     Cervical back: Neck supple.  Lymphadenopathy:     Cervical: No cervical adenopathy.  Skin:    Findings: No erythema or rash.  Neurological:     Mental Status: He is alert and oriented to person, place, and time.     Motor: No abnormal muscle tone.     Coordination: Coordination normal.  Psychiatric:        Mood and Affect: Mood and affect normal.        Behavior: Behavior normal.     ED Results / Procedures / Treatments   Labs (all labs ordered are listed, but only abnormal results are displayed) Labs Reviewed  COMPREHENSIVE METABOLIC PANEL - Abnormal; Notable for the following components:      Result Value   Sodium 131 (*)    Potassium 3.2 (*)    Glucose, Bld 100 (*)    Total Bilirubin 1.3 (*)    All other components within normal limits  CBC WITH DIFFERENTIAL/PLATELET    EKG None  Radiology CT ABDOMEN PELVIS W CONTRAST  Result Date: 07/22/2020 CLINICAL DATA:  Left abdomen pain with nausea vomiting for 2 weeks. Assess for abscess. EXAM: CT ABDOMEN AND PELVIS WITH CONTRAST TECHNIQUE: Multidetector CT imaging of the abdomen and pelvis was performed using the standard protocol following bolus administration of intravenous contrast. CONTRAST:  OMNIPAQUE IOHEXOL 300 MG/ML  SOLN COMPARISON:  January 02, 2020 FINDINGS: Lower chest: No acute abnormality. Hepatobiliary: No focal liver abnormality is seen. No gallstones, gallbladder wall thickening, or biliary dilatation. Pancreas: Unremarkable. No pancreatic ductal dilatation or surrounding inflammatory changes. Spleen: Normal in size without focal abnormality. Adrenals/Urinary Tract: Bilateral adrenal glands are normal. Simple cyst is identified in the  midpole of left kidney. The kidneys are otherwise normal. There is no hydronephrosis bilaterally. Bladder is normal. Stomach/Bowel: Stomach is within normal limits. Appendix appears normal. No evidence of bowel wall thickening, distention, or inflammatory changes. Vascular/Lymphatic: No significant vascular findings are present. No enlarged abdominal or pelvic lymph nodes. Reproductive: Prostate calcifications noted. Other: None. Musculoskeletal: No acute abnormality. IMPRESSION: 1. No acute abnormality identified in the abdomen and pelvis. No evidence of diverticulitis or appendicitis. No abscess is noted. 2. Simple cyst in the left kidney. Electronically Signed   By: Sherian Rein M.D.   On: 07/22/2020 10:44    Procedures Procedures (including critical care time)  Medications Ordered in ED Medications  ondansetron (ZOFRAN) injection 4 mg (4 mg Intravenous  Not Given 07/22/20 0940)  sodium chloride 0.9 % bolus 1,000 mL (0 mLs Intravenous Stopped 07/22/20 1224)  pantoprazole (PROTONIX) injection 40 mg (40 mg Intravenous Given 07/22/20 0938)  HYDROmorphone (DILAUDID) injection 0.5 mg (0.5 mg Intravenous Given 07/22/20 0937)  promethazine (PHENERGAN) injection 12.5 mg (12.5 mg Intravenous Given 07/22/20 0956)  iohexol (OMNIPAQUE) 300 MG/ML solution 100 mL (100 mLs Intravenous Contrast Given 07/22/20 1033)    ED Course  I have reviewed the triage vital signs and the nursing notes.  Pertinent labs & imaging results that were available during my care of the patient were reviewed by me and considered in my medical decision making (see chart peptic ulcer disease and history details).    MDM Rules/Calculators/A&P                          CT and labs unremarkable.  Patient was placed on protonic and referred to GI for peptic ulcer disease and follow-up on history of rectal bleeding Final Clinical Impression(s) / ED Diagnoses Final diagnoses:  Rectal bleeding    Rx / DC Orders ED Discharge Orders          Ordered    pantoprazole (PROTONIX) 20 MG tablet  Daily        07/22/20 1222    dicyclomine (BENTYL) 20 MG tablet        07/22/20 1222           Milton Ferguson, MD 07/26/20 1128

## 2020-07-22 NOTE — Discharge Instructions (Signed)
Follow-up with Dr. Marletta Lor or one of his associates at [facility].

## 2021-07-30 ENCOUNTER — Other Ambulatory Visit: Payer: Self-pay

## 2021-07-30 ENCOUNTER — Emergency Department (HOSPITAL_COMMUNITY): Payer: Self-pay

## 2021-07-30 ENCOUNTER — Emergency Department (HOSPITAL_COMMUNITY)
Admission: EM | Admit: 2021-07-30 | Discharge: 2021-07-30 | Disposition: A | Discharge status: Home / Self Care | Payer: Self-pay | Attending: Student | Admitting: Student

## 2021-07-30 ENCOUNTER — Encounter (HOSPITAL_COMMUNITY): Payer: Self-pay

## 2021-07-30 DIAGNOSIS — R0789 Other chest pain: Secondary | ICD-10-CM | POA: Insufficient documentation

## 2021-07-30 DIAGNOSIS — M25512 Pain in left shoulder: Secondary | ICD-10-CM | POA: Insufficient documentation

## 2021-07-30 LAB — COMPREHENSIVE METABOLIC PANEL
ALT: 28 U/L (ref 0–44)
AST: 26 U/L (ref 15–41)
Albumin: 4 g/dL (ref 3.5–5.0)
Alkaline Phosphatase: 76 U/L (ref 38–126)
Anion gap: 5 (ref 5–15)
BUN: 9 mg/dL (ref 6–20)
CO2: 26 mmol/L (ref 22–32)
Calcium: 8.9 mg/dL (ref 8.9–10.3)
Chloride: 106 mmol/L (ref 98–111)
Creatinine, Ser: 0.78 mg/dL (ref 0.61–1.24)
GFR, Estimated: >60 mL/min (ref >60)
Glucose, Bld: 118 mg/dL — ABNORMAL HIGH (ref 70–99)
Potassium: 3.7 mmol/L (ref 3.5–5.1)
Sodium: 137 mmol/L (ref 135–145)
Total Bilirubin: 0.7 mg/dL (ref 0.3–1.2)
Total Protein: 7.1 g/dL (ref 6.5–8.1)

## 2021-07-30 LAB — CBC WITH DIFFERENTIAL/PLATELET
Abs Immature Granulocytes: 0.01 10*3/uL (ref 0.00–0.07)
Basophils Absolute: 0.1 10*3/uL (ref 0.0–0.1)
Basophils Relative: 2 %
Eosinophils Absolute: 0.7 10*3/uL — ABNORMAL HIGH (ref 0.0–0.5)
Eosinophils Relative: 18 %
HCT: 44.5 % (ref 39.0–52.0)
Hemoglobin: 15.7 g/dL (ref 13.0–17.0)
Immature Granulocytes: 0 %
Lymphocytes Relative: 27 %
Lymphs Abs: 1.1 10*3/uL (ref 0.7–4.0)
MCH: 30.5 pg (ref 26.0–34.0)
MCHC: 35.3 g/dL (ref 30.0–36.0)
MCV: 86.4 fL (ref 80.0–100.0)
Monocytes Absolute: 0.4 10*3/uL (ref 0.1–1.0)
Monocytes Relative: 9 %
Neutro Abs: 1.9 10*3/uL (ref 1.7–7.7)
Neutrophils Relative %: 44 %
Platelets: 185 10*3/uL (ref 150–400)
RBC: 5.15 MIL/uL (ref 4.22–5.81)
RDW: 12.2 % (ref 11.5–15.5)
WBC: 4.1 10*3/uL (ref 4.0–10.5)
nRBC: 0 % (ref 0.0–0.2)

## 2021-07-30 LAB — TROPONIN I (HIGH SENSITIVITY)
Troponin I (High Sensitivity): 6 ng/L (ref <18)
Troponin I (High Sensitivity): 5 ng/L (ref <18)

## 2021-07-30 MED ORDER — ALUM & MAG HYDROXIDE-SIMETH 200-200-20 MG/5ML PO SUSP
30.0000 mL | Freq: Once | ORAL | Status: AC
  Administered 2021-07-30: 30 mL via ORAL
  Filled 2021-07-30: qty 30

## 2021-07-30 MED ORDER — LIDOCAINE VISCOUS HCL 2 % MT SOLN
15.0000 mL | Freq: Once | OROMUCOSAL | Status: AC
  Administered 2021-07-30: 15 mL via ORAL
  Filled 2021-07-30: qty 15

## 2021-07-30 NOTE — ED Provider Notes (Signed)
[facility] EMERGENCY DEPARTMENT Provider Note  CSN: [id number] Arrival date & time: 07/30/21 [id number]  Chief Complaint(s) Chest Pain  HPI George Newton is a 46 y.o. male with PMH peptic ulcer disease, GERD, allergies who presents emergency department for evaluation of chest pain.  Patient states that he awoke this morning with a feeling of chest tightness and left shoulder pain.  He states that this pain only lasted for about 5 minutes and has since resolved and he is currently not in pain in the emergency department.  He took his blood pressure at home and found to have systolics greater than 200 and thus came to the emergency department for evaluation.  He has a strong family history of cardiac disease as his father died early from a cardiac arrhythmia and he is worried that he is suffering the same.  In the ER currently he denies chest pain, shortness of breath, abdominal pain, nausea, vomiting or other systemic symptoms.  He states that he has felt this tightness before and it is always worse in the morning but resolves during the day.   Chest Pain Associated symptoms: no abdominal pain and no shortness of breath    Past Medical History Past Medical History:  Diagnosis Date   Renal disorder    There are no problems to display for this patient.  Home Medication(s) Prior to Admission medications   Medication Sig Start Date End Date Taking? Authorizing Provider  dicyclomine (BENTYL) 20 MG tablet Take one every 8 hours if needed for abdominal cramps 07/22/20   Bethann BerkshireZammit, Joseph, MD  metoCLOPramide (REGLAN) 10 MG tablet Take 1 tablet (10 mg total) by mouth every 6 (six) hours as needed for nausea (or headache). Patient not taking: Reported on 07/22/2020 01/07/20   Dione BoozeGlick, David, MD  ondansetron [facility](ZOFRAN ODT) 8 MG disintegrating tablet Take 1 tablet (8 mg total) by mouth every 8 (eight) hours as needed for nausea. Patient not taking: Reported on 07/22/2020 01/02/20   Derwood KaplanNanavati, Ankit, MD  pantoprazole  (PROTONIX) 20 MG tablet Take 1 tablet (20 mg total) by mouth daily. 07/22/20   Bethann BerkshireZammit, Joseph, MD  promethazine (PHENERGAN) 25 MG suppository Place 1 suppository (25 mg total) rectally every 6 (six) hours as needed for nausea or vomiting. Patient not taking: Reported on 07/22/2020 01/02/20   Derwood KaplanNanavati, Ankit, MD                                                                                                                                    Past Surgical History Past Surgical History:  Procedure Laterality Date   APPENDECTOMY     SHOULDER SURGERY     left shoulder   Family History No family history on file.  Social History Social History   Tobacco Use   Smoking status: Never   Smokeless tobacco: Current    Types: Chew  Substance Use Topics   Alcohol use: Not Currently   Drug  use: Yes    Types: Marijuana   Allergies Penicillins  Review of Systems Review of Systems  HENT:  Positive for congestion.   Respiratory:  Negative for shortness of breath.   Cardiovascular:  Positive for chest pain.  Gastrointestinal:  Negative for abdominal pain.   Physical Exam Vital Signs  I have reviewed the triage vital signs BP (!) 161/107    Pulse 71    Resp (!) 22    SpO2 97%   Physical Exam Vitals and nursing note reviewed.  Constitutional:      General: He is not in acute distress.    Appearance: He is well-developed.  HENT:     Head: Normocephalic and atraumatic.  Eyes:     Conjunctiva/sclera: Conjunctivae normal.  Cardiovascular:     Rate and Rhythm: Normal rate and regular rhythm.     Heart sounds: No murmur heard. Pulmonary:     Effort: Pulmonary effort is normal. No respiratory distress.     Breath sounds: Normal breath sounds.  Abdominal:     Palpations: Abdomen is soft.     Tenderness: There is no abdominal tenderness.  Musculoskeletal:        General: No swelling.     Cervical back: Neck supple.  Skin:    General: Skin is warm and dry.     Capillary Refill: Capillary  refill takes less than 2 seconds.  Neurological:     Mental Status: He is alert.  Psychiatric:        Mood and Affect: Mood normal.    ED Results and Treatments Labs (all labs ordered are listed, but only abnormal results are displayed) Labs Reviewed  CBC WITH DIFFERENTIAL/PLATELET - Abnormal; Notable for the following components:      Result Value   Eosinophils Absolute 0.7 (*)    All other components within normal limits  COMPREHENSIVE METABOLIC PANEL  TROPONIN I (HIGH SENSITIVITY)                                                                                                                          Radiology No results found.  Pertinent labs & imaging results that were available during my care of the patient were reviewed by me and considered in my medical decision making (see MDM for details).  Medications Ordered in ED Medications  alum & mag hydroxide-simeth (MAALOX/MYLANTA) 200-200-20 MG/5ML suspension 30 mL (has no administration in time range)    And  lidocaine (XYLOCAINE) 2 % viscous mouth solution 15 mL (has no administration in time range)  Procedures Procedures  (including critical care time)  Medical Decision Making / ED Course   This patient presents to the ED for concern of chest pain, this involves an extensive number of treatment options, and is a complaint that carries with it a high risk of complications and morbidity.  The differential diagnosis includes ACS, PE, GERD, musculoskeletal strain  MDM: Patient seen emergency department for evaluation of chest pain.  Physical exam unremarkable.  Laboratory evaluation including troponin and delta troponin unremarkable.  Chest x-ray unremarkable ECG was normal sinus rhythm, no evidence of ischemia.  Patient's heart score is 1.  Patient is PERC negative patient for PE at this time  particularly in the setting of no shortness of breath or hypoxia.  Patient was given a GI cocktail which led to complete resolution of his symptoms.  Suspect reflux with possible silent aspiration particularly at night causing his symptoms.  Patient was encouraged to continue taking his omeprazole and speak with his primary care physician.  Patient then discharged with strict return precautions of which he voiced understanding.   Additional history obtained: -External records from outside source obtained and reviewed including: Chart review including previous notes, labs, imaging, consultation notes   Lab Tests: -I ordered, reviewed, and interpreted labs.  The pertinent results include: Normal laboratory work-up with normal troponins   EKG  EKG Interpretation  Date/Time:  Tuesday July 30 2021 06:52:16 EST Ventricular Rate:  57 PR Interval:  160 QRS Duration: 98 QT Interval:  414 QTC Calculation: 404 R Axis:   75 Text Interpretation: Sinus rhythm Confirmed by Ross Marcus ([id number]) on 07/30/2021 6:54:35 AM         Imaging Studies ordered: I ordered imaging studies including chest xray I independently visualized and interpreted imaging. I agree with the radiologist interpretation   Medicines ordered and prescription drug management: Meds ordered this encounter  Medications   AND Linked Order Group    alum & mag hydroxide-simeth (MAALOX/MYLANTA) 200-200-20 MG/5ML suspension 30 mL    lidocaine (XYLOCAINE) 2 % viscous mouth solution 15 mL    -Reevaluation of the patient after these medicines showed that the patient improved -I have reviewed the patients home medicines and have made adjustments as needed  Critical interventions none    Cardiac Monitoring: The patient was maintained on a cardiac monitor.  I personally viewed and interpreted the cardiac monitored which showed an underlying rhythm of: Normal sinus rhythm vs sinus bradycardia   Reevaluation: After the  interventions noted above, I reevaluated the patient and found that they have :improved  Co morbidities that complicate the patient evaluation  Past Medical History:  Diagnosis Date   Renal disorder       Dispostion: dc     Final Clinical Impression(s) / ED Diagnoses Final diagnoses:  None     @PCDICTATION @    , MD 07/30/21 1510

## 2021-07-30 NOTE — ED Triage Notes (Signed)
Pt to ED by POV from home with c/o L sided CP which began when he woke up this morning, sharp in nature. Pt endorses a history of GERD and stomach ulcers. Arrives hypertensive, all other VSS.

## 2021-10-02 IMAGING — CT CT ABD-PELV W/ CM
2 of 5 series · 16 of 46 positions shown, 18 images · IV contrast (Omnipaque or Isovue)
Comparison: None.

CLINICAL DATA: Abdominal pain, fever.  Previous appendectomy.

EXAM:
CT ABDOMEN AND PELVIS WITH CONTRAST
TECHNIQUE: Multidetector CT imaging of the abdomen and pelvis was performed
using the standard protocol following bolus administration of
intravenous contrast.
CONTRAST:  100mL OMNIPAQUE IOHEXOL 300 MG/ML  SOLN

[Series 2: axial st · axial · 0.73mm/px · z∈[-644,-174]mm · 13 of 106 slices shown, 15 images]
[im 6/106  soft-tissue]
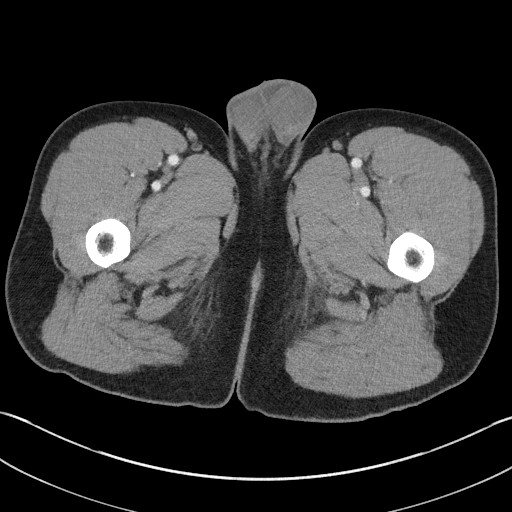
[im 6/106  bone]
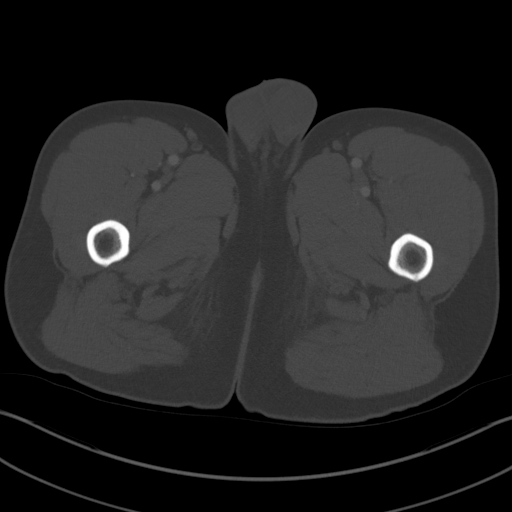
[im 12/106  soft-tissue]
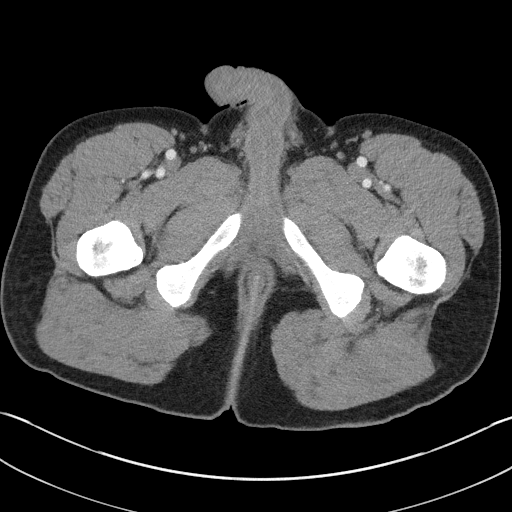
[im 24/106  soft-tissue]
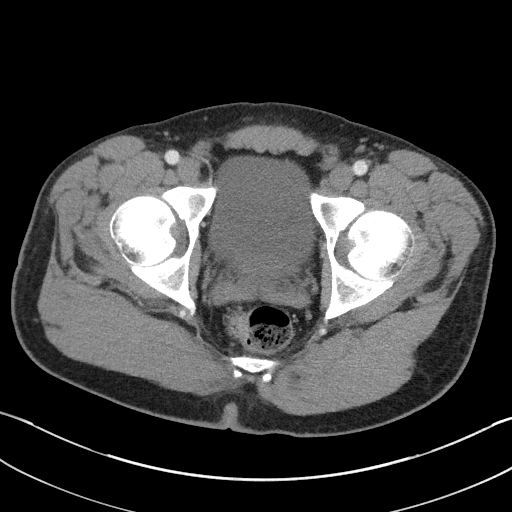
[im 30/106  soft-tissue]
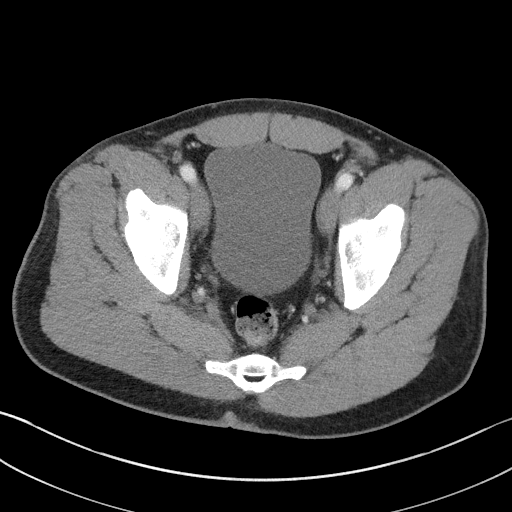
[im 36/106  soft-tissue]
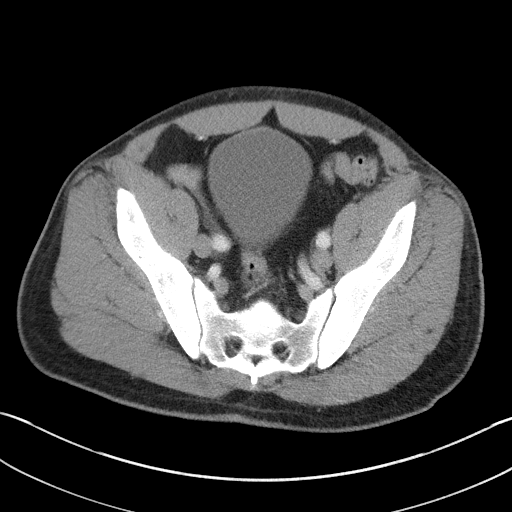
[im 47/106  soft-tissue]
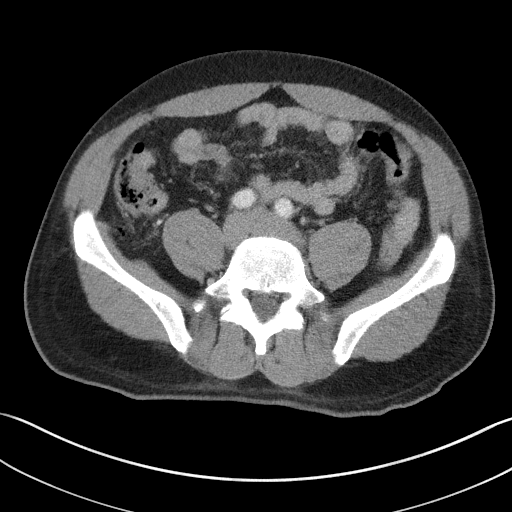
[im 53/106  soft-tissue]
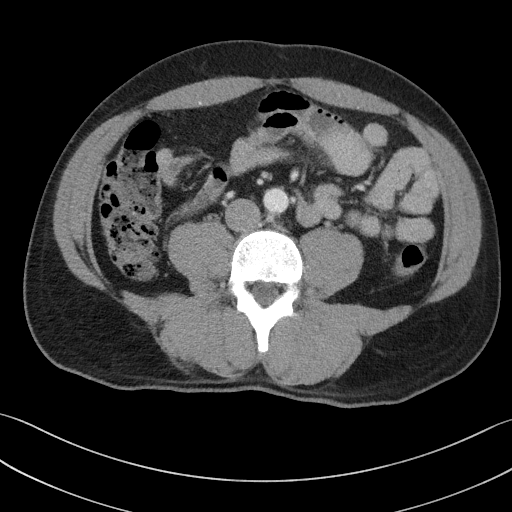
[im 59/106  soft-tissue]
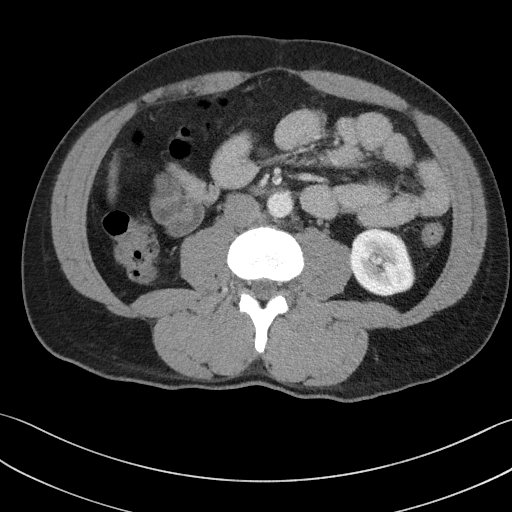
[im 71/106  soft-tissue]
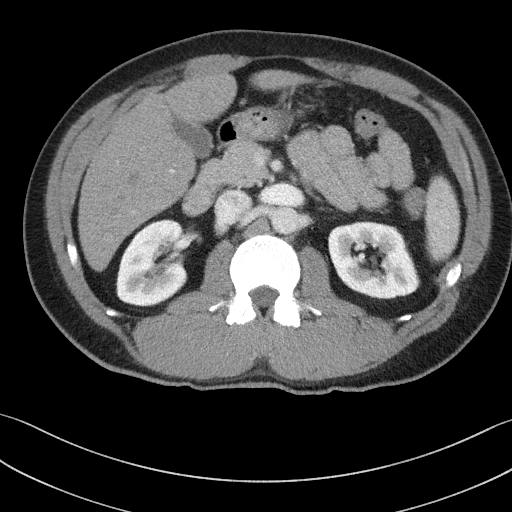
[im 71/106  bone]
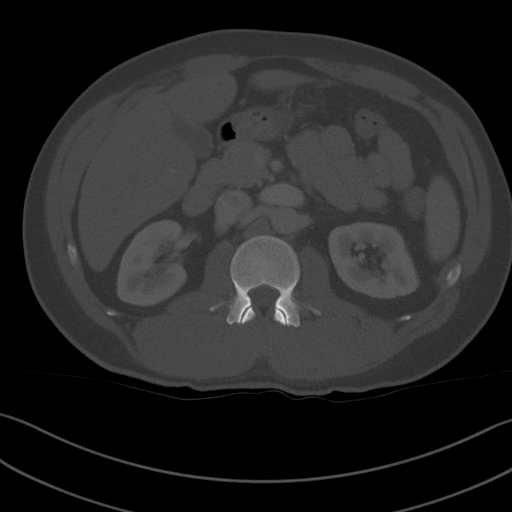
[im 76/106  soft-tissue]
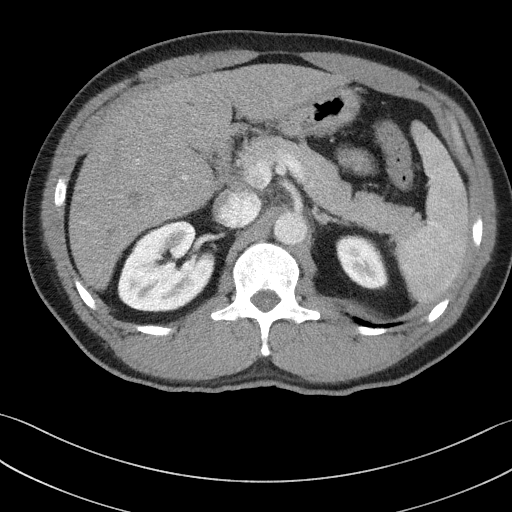
[im 82/106  soft-tissue]
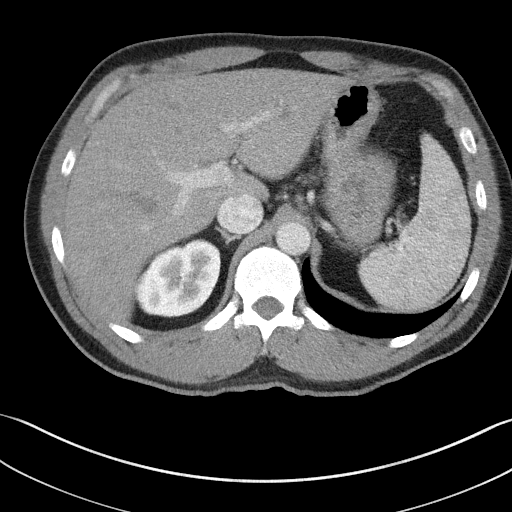
[im 94/106  soft-tissue]
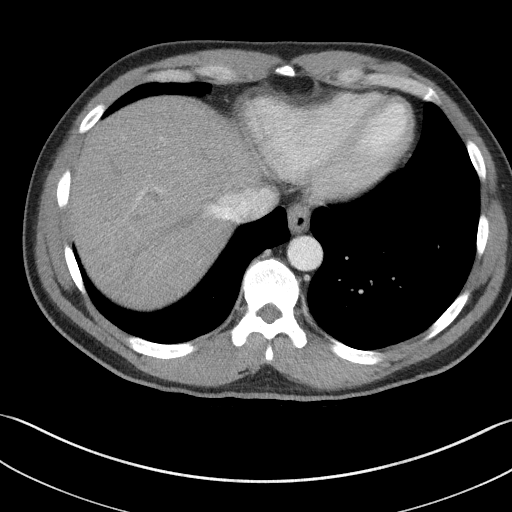
[im 100/106  soft-tissue]
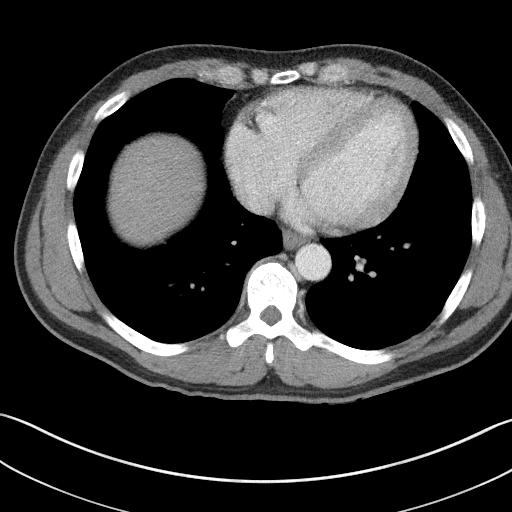

[Series 6: coronal st · coronal · 0.72mm/px · 3 of 93 slices shown]
[im 31/93  soft-tissue]
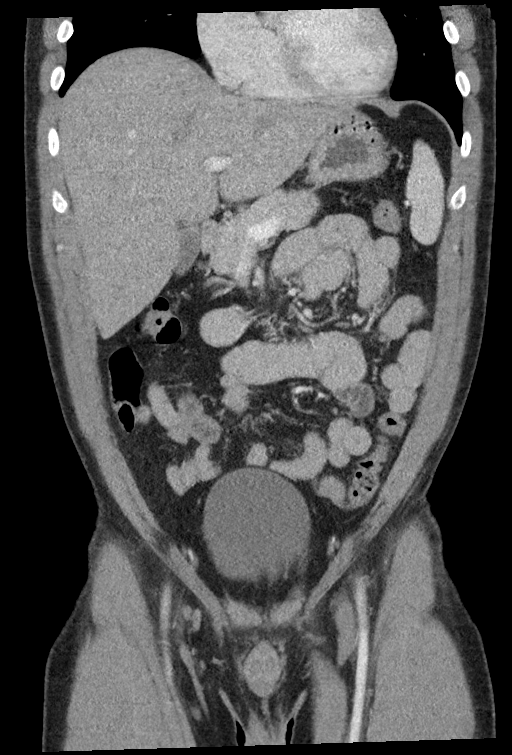
[im 41/93  soft-tissue]
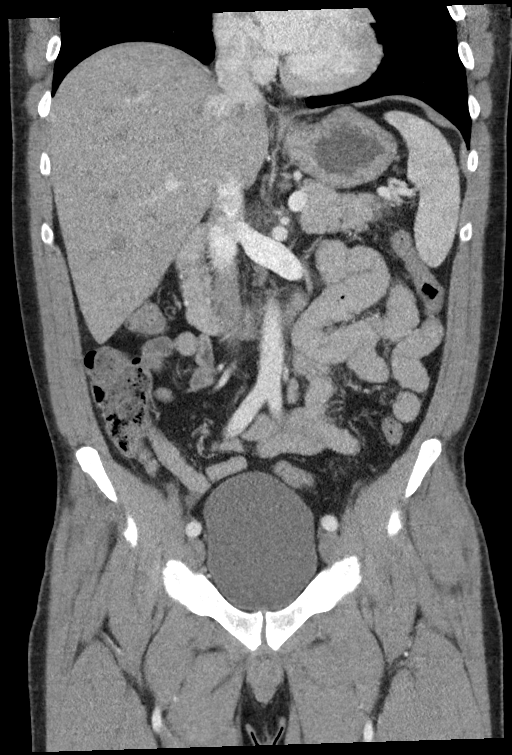
[im 52/93  soft-tissue]
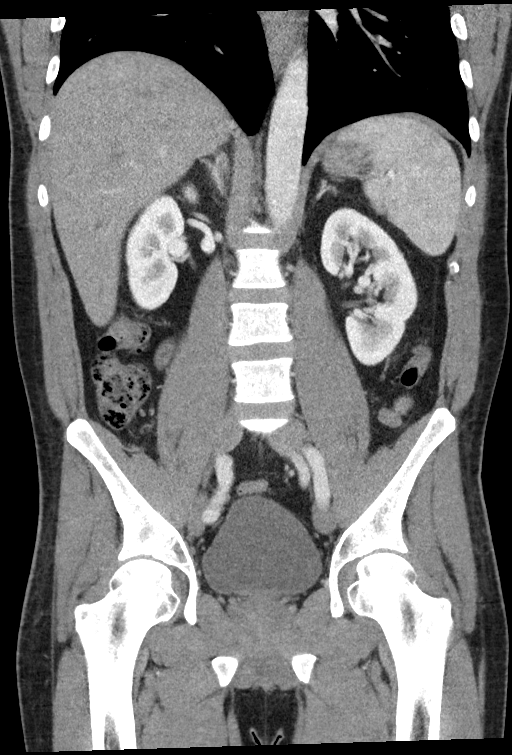

[16 of 46 positions shown; findings below may reference images not displayed]

FINDINGS: Lower chest: No acute abnormality.

Hepatobiliary: No focal liver abnormality is seen. No gallstones,
gallbladder wall thickening, or biliary dilatation.

Pancreas: Unremarkable. No pancreatic ductal dilatation or
surrounding inflammatory changes.

Spleen: Normal in size without focal abnormality.

Adrenals/Urinary Tract: Adrenal glands are unremarkable. Kidneys are
normal, without renal calculi, focal lesion, or hydronephrosis.
Bladder is unremarkable.

Stomach/Bowel: Stomach and small bowel decompressed. Appendix
surgically absent. The colon is nondilated, unremarkable.

Vascular/Lymphatic: No significant vascular findings are present. No
enlarged abdominal or pelvic lymph nodes.

Reproductive: Prostate is unremarkable.

Other: No ascites.  No free air.

Musculoskeletal: No acute or significant osseous findings.
IMPRESSION: No acute findings.

## 2021-11-29 ENCOUNTER — Ambulatory Visit: Payer: Self-pay | Admitting: Nurse Practitioner

## 2021-11-29 ENCOUNTER — Encounter: Payer: Self-pay | Admitting: Nurse Practitioner

## 2021-11-29 VITALS — BP 136/87 | HR 71 | Temp 98.2°F | Ht 72.0 in | Wt 207.4 lb

## 2021-11-29 DIAGNOSIS — Z1211 Encounter for screening for malignant neoplasm of colon: Secondary | ICD-10-CM

## 2021-11-29 DIAGNOSIS — Z7689 Persons encountering health services in other specified circumstances: Secondary | ICD-10-CM

## 2021-11-29 DIAGNOSIS — K219 Gastro-esophageal reflux disease without esophagitis: Secondary | ICD-10-CM

## 2021-11-29 DIAGNOSIS — R110 Nausea: Secondary | ICD-10-CM

## 2021-11-29 MED ORDER — PANTOPRAZOLE SODIUM 40 MG PO TBEC: 40.0000 mg | DELAYED_RELEASE_TABLET | Freq: Every day | ORAL | 3 refills | Status: DC

## 2021-11-29 NOTE — Patient Instructions (Signed)
Gastroesophageal Reflux Disease, Adult  Gastroesophageal reflux (GER) happens when acid from the stomach flows up into the tube that connects the mouth and the stomach (esophagus). Normally, food travels down the esophagus and stays in the stomach to be digested. With GER, food and stomach acid sometimes move back up into the esophagus. You may have a disease called gastroesophageal reflux disease (GERD) if the reflux: Happens often. Causes frequent or very bad symptoms. Causes problems such as damage to the esophagus. When this happens, the esophagus becomes sore and swollen. Over time, GERD can make small holes (ulcers) in the lining of the esophagus. What are the causes? This condition is caused by a problem with the muscle between the esophagus and the stomach. When this muscle is weak or not normal, it does not close properly to keep food and acid from coming back up from the stomach. The muscle can be weak because of: Tobacco use. Pregnancy. Having a certain type of hernia (hiatal hernia). Alcohol use. Certain foods and drinks, such as coffee, chocolate, onions, and peppermint. What increases the risk? Being overweight. Having a disease that affects your connective tissue. Taking NSAIDs, such a ibuprofen. What are the signs or symptoms? Heartburn. Difficult or painful swallowing. The feeling of having a lump in the throat. A bitter taste in the mouth. Bad breath. Having a lot of saliva. Having an upset or bloated stomach. Burping. Chest pain. Different conditions can cause chest pain. Make sure you see your doctor if you have chest pain. Shortness of breath or wheezing. A long-term cough or a cough at night. Wearing away of the surface of teeth (tooth enamel). Weight loss. How is this treated? Making changes to your diet. Taking medicine. Having surgery. Treatment will depend on how bad your symptoms are. Follow these instructions at home: Eating and drinking  Follow a  diet as told by your doctor. You may need to avoid foods and drinks such as: Coffee and tea, with or without caffeine. Drinks that contain alcohol. Energy drinks and sports drinks. Bubbly (carbonated) drinks or sodas. Chocolate and cocoa. Peppermint and mint flavorings. Garlic and onions. Horseradish. Spicy and acidic foods. These include peppers, chili powder, curry powder, vinegar, hot sauces, and BBQ sauce. Citrus fruit juices and citrus fruits, such as oranges, lemons, and limes. Tomato-based foods. These include red sauce, chili, salsa, and pizza with red sauce. Fried and fatty foods. These include donuts, french fries, potato chips, and high-fat dressings. High-fat meats. These include hot dogs, rib eye steak, sausage, ham, and bacon. High-fat dairy items, such as whole milk, butter, and cream cheese. Eat small meals often. Avoid eating large meals. Avoid drinking large amounts of liquid with your meals. Avoid eating meals during the 2-3 hours before bedtime. Avoid lying down right after you eat. Do not exercise right after you eat. Lifestyle  Do not smoke or use any products that contain nicotine or tobacco. If you need help quitting, ask your doctor. Try to lower your stress. If you need help doing this, ask your doctor. If you are overweight, lose an amount of weight that is healthy for you. Ask your doctor about a safe weight loss goal. General instructions Pay attention to any changes in your symptoms. Take over-the-counter and prescription medicines only as told by your doctor. Do not take aspirin, ibuprofen, or other NSAIDs unless your doctor says it is okay. Wear loose clothes. Do not wear anything tight around your waist. Raise (elevate) the head of your bed about   6 inches (15 cm). You may need to use a wedge to do this. Avoid bending over if this makes your symptoms worse. Keep all follow-up visits. Contact a doctor if: You have new symptoms. You lose weight and you  do not know why. You have trouble swallowing or it hurts to swallow. You have wheezing or a cough that keeps happening. You have a hoarse voice. Your symptoms do not get better with treatment. Get help right away if: You have sudden pain in your arms, neck, jaw, teeth, or back. You suddenly feel sweaty, dizzy, or light-headed. You have chest pain or shortness of breath. You vomit and the vomit is green, yellow, or black, or it looks like blood or coffee grounds. You faint. Your poop (stool) is red, bloody, or black. You cannot swallow, drink, or eat. These symptoms may represent a serious problem that is an emergency. Do not wait to see if the symptoms will go away. Get medical help right away. Call your local emergency services (911 in the [place] Do not drive yourself to the hospital. Summary If a person has gastroesophageal reflux disease (GERD), food and stomach acid move back up into the esophagus and cause symptoms or problems such as damage to the esophagus. Treatment will depend on how bad your symptoms are. Follow a diet as told by your doctor. Take all medicines only as told by your doctor. This information is not intended to replace advice given to you by your health care provider. Make sure you discuss any questions you have with your health care provider. Document Revised: 01/16/2020 Document Reviewed: 01/16/2020 Elsevier Patient Education  2023 [employer]  

## 2021-11-29 NOTE — Progress Notes (Addendum)
? ?New Patient Note ? ?RE: George Newton MRN: [id number] DOB: January 09, 1976 ?Date of Office Visit: 11/29/2021 ? ?Chief Complaint: stomach ulcer and Establish Care ? ?History of Present Illness: ? ?Patient is a 46 year old male who presents to clinic to establish care with a history of stomach ulcer. ?Patient reports in the past few days abdominal pain has increased with eating spicy food and the general discomfort.  Patient was on Protonix 20 mg tablet by mouth daily which provided mild to moderate relief but stopped taking medication for a while. ?No fever, nausea or vomiting associated with current complaint. ? ? ? ?Assessment and Plan: ?George Newton is a 46 y.o. male with: ?Chronic GERD ?Increase Protonix to 40 mg tablet by mouth daily.  Completed referral to gastroenterology.  Advised patient to minimize all fatty and spicy foods.  Watch out for worsening or unresolved symptoms.  Patient is to follow-up.  Education provided to patient with printed handouts given. ? ?Encounter to establish care ?Patient is a 46 year old male who presents to clinic to establish care.  Completed head to toe assessment.  Provided patient with education on preventative care and health maintenance.  Patient verbalized understanding and  will make appointment for annual physical exam.  Completed Cologuard orders.  ? ? ?Return if symptoms worsen or fail to improve. ? ? ?Diagnostics: ? ? ?Past Medical History: ?Patient Active Problem List  ? Diagnosis Date Noted  ? Chronic GERD 11/29/2021  ? Encounter to establish care 11/29/2021  ? ?Past Medical History:  ?Diagnosis Date  ? Renal disorder   ? ?Past Surgical History: ?Past Surgical History:  ?Procedure Laterality Date  ? APPENDECTOMY    ? shoulder Left   ? SHOULDER SURGERY    ? left shoulder  ? ?Medication List:  ?Current Outpatient Medications  ?Medication Sig Dispense Refill  ? pantoprazole (PROTONIX) 40 MG tablet Take 1 tablet (40 mg total) by mouth daily. 30 tablet 3  ? dicyclomine (BENTYL) 20 MG  tablet Take one every 8 hours if needed for abdominal cramps (Patient not taking: Reported on 07/30/2021) 20 tablet 0  ? metoCLOPramide (REGLAN) 10 MG tablet Take 1 tablet (10 mg total) by mouth every 6 (six) hours as needed for nausea (or headache). (Patient not taking: Reported on 07/22/2020) 30 tablet 0  ? omeprazole (PRILOSEC) 20 MG capsule Take 20 mg by mouth daily as needed (gerd). (Patient not taking: Reported on 11/29/2021)    ? ondansetron (ZOFRAN ODT) 8 MG disintegrating tablet Take 1 tablet (8 mg total) by mouth every 8 (eight) hours as needed for nausea. (Patient not taking: Reported on 07/22/2020) 20 tablet 0  ? promethazine (PHENERGAN) 25 MG suppository Place 1 suppository (25 mg total) rectally every 6 (six) hours as needed for nausea or vomiting. (Patient not taking: Reported on 07/22/2020) 12 each 0  ? ?No current facility-administered medications for this visit.  ? ?Allergies: ?Allergies  ?Allergen Reactions  ? Penicillins Rash  ? ?Social History: ?Social History  ? ?Socioeconomic History  ? Marital status: Married  ?  Spouse name: Shelly Rubenstein  ? Number of children: 1  ? Years of education: Not on file  ? Highest education level: Not on file  ?Occupational History  ? Not on file  ?Tobacco Use  ? Smoking status: Former  ?  Types: Cigarettes  ?  Quit date: 2020  ?  Years since quitting: 3.3  ? Smokeless tobacco: Former  ?  Types: Chew  ?  Quit date: 2021  ?Vaping Use  ?  Vaping Use: Never used  ?Substance and Sexual Activity  ? Alcohol use: Not Currently  ? Drug use: Yes  ?  Types: Marijuana  ? Sexual activity: Yes  ?Other Topics Concern  ? Not on file  ?Social History Narrative  ? Not on file  ? ?Social Determinants of Health  ? ?Financial Resource Strain: Not on file  ?Food Insecurity: Not on file  ?Transportation Needs: Not on file  ?Physical Activity: Not on file  ?Stress: Not on file  ?Social Connections: Not on file  ? ? ? ? ? ?Family History: ?Family History  ?Problem Relation Age of Onset  ? Cancer Mother    ? Stroke Mother   ? Heart disease Father   ? ADD / ADHD Son   ? Asthma Son   ? Kidney disease Maternal Grandmother   ? ?     ? ?Review of Systems  ?Constitutional: Negative.   ?HENT: Negative.    ?Respiratory: Negative.    ?Cardiovascular: Negative.   ?Gastrointestinal: Negative.   ?Genitourinary: Negative.   ?Musculoskeletal: Negative.   ?Skin: Negative.   ?Neurological: Negative.   ?All other systems reviewed and are negative. ?Objective: ?BP 136/87   Pulse 71   Temp 98.2 ?F (36.8 ?C)   Ht 6' (1.829 m)   Wt 207 lb 6.4 oz (94.1 kg)   SpO2 97%   BMI 28.13 kg/m?  ?Body mass index is 28.13 kg/m?Marland Kitchen ?Physical Exam ?Vitals and nursing note reviewed.  ?Constitutional:   ?   Appearance: Normal appearance. He is normal weight.  ?HENT:  ?   Head: Normocephalic.  ?   Right Ear: External ear normal.  ?   Nose: Nose normal.  ?   Mouth/Throat:  ?   Pharynx: Oropharynx is clear.  ?Eyes:  ?   Conjunctiva/sclera: Conjunctivae normal.  ?Cardiovascular:  ?   Rate and Rhythm: Normal rate and regular rhythm.  ?Pulmonary:  ?   Effort: Pulmonary effort is normal.  ?   Breath sounds: Normal breath sounds.  ?Abdominal:  ?   General: Bowel sounds are normal.  ?Musculoskeletal:     ?   General: Normal range of motion.  ?Skin: ?   General: Skin is warm.  ?Neurological:  ?   General: No focal deficit present.  ?   Mental Status: He is oriented to person, place, and time.  ?Psychiatric:     ?   Behavior: Behavior normal.  ? ?The plan was reviewed with the patient/family, and all questions/concerned were addressed. ? ?It was my pleasure to see [name] today and participate in his care. Please feel free to contact me with any questions or concerns. ? ?Sincerely, ? ?Lynnell Chad NP ?[facility] ?

## 2021-11-29 NOTE — Assessment & Plan Note (Signed)
Increase Protonix to 40 mg tablet by mouth daily.  Completed referral to gastroenterology.  Advised patient to minimize all fatty and spicy foods.  Watch out for worsening or unresolved symptoms.  Patient is to follow-up.  Education provided to patient with printed handouts given. ?

## 2021-11-29 NOTE — Assessment & Plan Note (Signed)
Patient is a 46 year old male who presents to clinic to establish care.  Completed head to toe assessment.  Provided patient with education on preventative care and health maintenance.  Patient verbalized understanding and  will make appointment for annual physical exam.  Completed Cologuard orders.  ? ? ?

## 2021-12-04 ENCOUNTER — Encounter (INDEPENDENT_AMBULATORY_CARE_PROVIDER_SITE_OTHER): Payer: Self-pay | Admitting: *Deleted

## 2022-01-13 ENCOUNTER — Encounter (INDEPENDENT_AMBULATORY_CARE_PROVIDER_SITE_OTHER): Payer: Self-pay

## 2022-01-13 ENCOUNTER — Other Ambulatory Visit (INDEPENDENT_AMBULATORY_CARE_PROVIDER_SITE_OTHER): Payer: Self-pay

## 2022-01-13 ENCOUNTER — Telehealth (INDEPENDENT_AMBULATORY_CARE_PROVIDER_SITE_OTHER): Payer: Self-pay

## 2022-01-13 ENCOUNTER — Ambulatory Visit (INDEPENDENT_AMBULATORY_CARE_PROVIDER_SITE_OTHER): Payer: Self-pay | Admitting: Gastroenterology

## 2022-01-13 ENCOUNTER — Encounter (INDEPENDENT_AMBULATORY_CARE_PROVIDER_SITE_OTHER): Payer: Self-pay | Admitting: Gastroenterology

## 2022-01-13 VITALS — BP 138/83 | HR 72 | Temp 98.8°F | Ht 72.0 in | Wt 217.4 lb

## 2022-01-13 DIAGNOSIS — R197 Diarrhea, unspecified: Secondary | ICD-10-CM

## 2022-01-13 DIAGNOSIS — R112 Nausea with vomiting, unspecified: Secondary | ICD-10-CM

## 2022-01-13 DIAGNOSIS — R1012 Left upper quadrant pain: Secondary | ICD-10-CM

## 2022-01-13 DIAGNOSIS — R109 Unspecified abdominal pain: Secondary | ICD-10-CM

## 2022-01-13 MED ORDER — DICYCLOMINE HCL 10 MG PO CAPS: 10.0000 mg | ORAL_CAPSULE | Freq: Three times a day (TID) | ORAL | 1 refills | Status: DC | PRN

## 2022-01-13 MED ORDER — PEG 3350-KCL-NA BICARB-NACL 420 G PO SOLR: 4000.0000 mL | ORAL | 0 refills | Status: DC

## 2022-01-14 ENCOUNTER — Encounter (INDEPENDENT_AMBULATORY_CARE_PROVIDER_SITE_OTHER): Payer: Self-pay

## 2022-02-11 ENCOUNTER — Encounter (HOSPITAL_COMMUNITY)
Admission: RE | Admit: 2022-02-11 | Discharge: 2022-02-11 | Disposition: A | Discharge status: Home / Self Care | Payer: Self-pay | Source: Ambulatory Visit | Attending: Gastroenterology | Admitting: Gastroenterology

## 2022-02-11 ENCOUNTER — Encounter (INDEPENDENT_AMBULATORY_CARE_PROVIDER_SITE_OTHER): Payer: Self-pay

## 2022-02-11 HISTORY — DX: Gastric ulcer, unspecified as acute or chronic, without hemorrhage or perforation: K25.9

## 2022-02-11 HISTORY — DX: Personal history of urinary calculi: Z87.442

## 2022-02-11 HISTORY — DX: Gastro-esophageal reflux disease without esophagitis: K21.9

## 2022-02-12 ENCOUNTER — Encounter (HOSPITAL_COMMUNITY): Payer: Self-pay

## 2022-02-13 ENCOUNTER — Encounter (HOSPITAL_COMMUNITY): Payer: Self-pay | Admitting: *Deleted

## 2022-02-13 ENCOUNTER — Emergency Department (HOSPITAL_COMMUNITY)
Admission: EM | Admit: 2022-02-13 | Discharge: 2022-02-13 | Disposition: A | Discharge status: Home / Self Care | Payer: Self-pay | Attending: Emergency Medicine | Admitting: Emergency Medicine

## 2022-02-13 ENCOUNTER — Other Ambulatory Visit: Payer: Self-pay

## 2022-02-13 ENCOUNTER — Encounter (HOSPITAL_COMMUNITY): Payer: Self-pay | Admitting: Certified Registered"

## 2022-02-13 ENCOUNTER — Ambulatory Visit (HOSPITAL_COMMUNITY): Admission: RE | Admit: 2022-02-13 | Payer: Self-pay | Source: Home / Self Care | Admitting: Gastroenterology

## 2022-02-13 ENCOUNTER — Encounter (HOSPITAL_COMMUNITY)
Admission: EM | Disposition: A | Discharge status: Home / Self Care | Payer: Self-pay | Source: Home / Self Care | Attending: Emergency Medicine

## 2022-02-13 DIAGNOSIS — R112 Nausea with vomiting, unspecified: Secondary | ICD-10-CM | POA: Insufficient documentation

## 2022-02-13 DIAGNOSIS — R001 Bradycardia, unspecified: Secondary | ICD-10-CM | POA: Insufficient documentation

## 2022-02-13 DIAGNOSIS — R1013 Epigastric pain: Secondary | ICD-10-CM | POA: Insufficient documentation

## 2022-02-13 LAB — LIPASE, BLOOD: Lipase: 41 U/L (ref 11–51)

## 2022-02-13 LAB — COMPREHENSIVE METABOLIC PANEL
ALT: 27 U/L (ref 0–44)
AST: 23 U/L (ref 15–41)
Albumin: 4.9 g/dL (ref 3.5–5.0)
Alkaline Phosphatase: 85 U/L (ref 38–126)
Anion gap: 10 (ref 5–15)
BUN: 11 mg/dL (ref 6–20)
CO2: 25 mmol/L (ref 22–32)
Calcium: 9.8 mg/dL (ref 8.9–10.3)
Chloride: 105 mmol/L (ref 98–111)
Creatinine, Ser: 0.98 mg/dL (ref 0.61–1.24)
GFR, Estimated: >60 mL/min (ref >60)
Glucose, Bld: 165 mg/dL — ABNORMAL HIGH (ref 70–99)
Potassium: 3.3 mmol/L — ABNORMAL LOW (ref 3.5–5.1)
Sodium: 140 mmol/L (ref 135–145)
Total Bilirubin: 1.9 mg/dL — ABNORMAL HIGH (ref 0.3–1.2)
Total Protein: 8.2 g/dL — ABNORMAL HIGH (ref 6.5–8.1)

## 2022-02-13 LAB — CBC WITH DIFFERENTIAL/PLATELET
Abs Immature Granulocytes: 0.03 10*3/uL (ref 0.00–0.07)
Basophils Absolute: 0 10*3/uL (ref 0.0–0.1)
Basophils Relative: 0 %
Eosinophils Absolute: 0.1 10*3/uL (ref 0.0–0.5)
Eosinophils Relative: 2 %
HCT: 51.2 % (ref 39.0–52.0)
Hemoglobin: 17.8 g/dL — ABNORMAL HIGH (ref 13.0–17.0)
Immature Granulocytes: 0 %
Lymphocytes Relative: 11 %
Lymphs Abs: 0.9 10*3/uL (ref 0.7–4.0)
MCH: 29.3 pg (ref 26.0–34.0)
MCHC: 34.8 g/dL (ref 30.0–36.0)
MCV: 84.3 fL (ref 80.0–100.0)
Monocytes Absolute: 0.3 10*3/uL (ref 0.1–1.0)
Monocytes Relative: 4 %
Neutro Abs: 6.5 10*3/uL (ref 1.7–7.7)
Neutrophils Relative %: 83 %
Platelets: 229 10*3/uL (ref 150–400)
RBC: 6.07 MIL/uL — ABNORMAL HIGH (ref 4.22–5.81)
RDW: 12.9 % (ref 11.5–15.5)
WBC: 7.8 10*3/uL (ref 4.0–10.5)
nRBC: 0 % (ref 0.0–0.2)

## 2022-02-13 LAB — MAGNESIUM: Magnesium: 1.9 mg/dL (ref 1.7–2.4)

## 2022-02-13 LAB — TROPONIN I (HIGH SENSITIVITY): Troponin I (High Sensitivity): 4 ng/L (ref <18)

## 2022-02-13 SURGERY — COLONOSCOPY WITH PROPOFOL
Anesthesia: Monitor Anesthesia Care

## 2022-02-13 MED ORDER — FAMOTIDINE IN NACL 20-0.9 MG/50ML-% IV SOLN
20.0000 mg | Freq: Once | INTRAVENOUS | Status: AC
  Administered 2022-02-13: 20 mg via INTRAVENOUS
  Filled 2022-02-13: qty 50

## 2022-02-13 MED ORDER — SODIUM CHLORIDE 0.9 % IV BOLUS
1000.0000 mL | Freq: Once | INTRAVENOUS | Status: AC
  Administered 2022-02-13: 1000 mL via INTRAVENOUS

## 2022-02-13 MED ORDER — PROMETHAZINE HCL 25 MG PO TABS: 25.0000 mg | ORAL_TABLET | Freq: Four times a day (QID) | ORAL | 0 refills | Status: DC | PRN

## 2022-02-13 MED ORDER — PROMETHAZINE HCL 25 MG RE SUPP
25.0000 mg | Freq: Four times a day (QID) | RECTAL | 0 refills | Status: DC | PRN
Start: 2022-02-13 — End: 2022-06-04

## 2022-02-13 MED ORDER — SODIUM CHLORIDE 0.9 % IV SOLN
12.5000 mg | Freq: Once | INTRAVENOUS | Status: AC
  Administered 2022-02-13: 12.5 mg via INTRAVENOUS
  Filled 2022-02-13: qty 0.5

## 2022-02-13 NOTE — ED Provider Notes (Signed)
[facility] EMERGENCY DEPARTMENT Provider Note   CSN: [id number] Arrival date & time: 02/13/22  2993     History  Chief Complaint  Patient presents with   Emesis    George Newton is a 46 y.o. male presenting to the ER for nausea and vomiting.  The patient voices a history of gastric ulcers and takes Protonix for it.  He was scheduled to have an EGD and a colonoscopy today.  He started his bowel prep yesterday, drank most of the solution, but then immediately began having persistent vomiting, and says he has been vomiting for 24 hours.  He feels very dehydrated.  He says he feels weak all over.  He reports that several bowel movements after the bowel prep, but is not having persistent diarrhea.  He reports a history of an appendectomy, kidney stone lithotripsy, no other abdominal surgeries or procedures.  HPI     Home Medications Prior to Admission medications   Medication Sig Start Date End Date Taking? Authorizing Provider  promethazine (PHENERGAN) 25 MG suppository Place 1 suppository (25 mg total) rectally every 6 (six) hours as needed for up to 6 days for nausea or vomiting. 02/13/22 02/19/22 Yes Aleksis Jiggetts, Kermit Balo, MD  promethazine (PHENERGAN) 25 MG tablet Take 1 tablet (25 mg total) by mouth every 6 (six) hours as needed for up to 6 doses for nausea or vomiting. 02/13/22  Yes Mitsuo Budnick, Kermit Balo, MD  acetaminophen (TYLENOL) 500 MG tablet Take 500 mg by mouth every 6 (six) hours as needed for moderate pain.    [provider]  cetirizine (ZYRTEC) 10 MG tablet Take 10 mg by mouth daily as needed for allergies.    [provider]  dicyclomine (BENTYL) 10 MG capsule Take 1 capsule (10 mg total) by mouth 3 (three) times daily as needed for spasms. Patient not taking: Reported on 02/12/2022 01/13/22   Raquel James, NP  pantoprazole (PROTONIX) 40 MG tablet Take 1 tablet (40 mg total) by mouth daily. 11/29/21   Daryll Drown, NP  polyethylene glycol-electrolytes (TRILYTE)  420 g solution Take 4,000 mLs by mouth as directed. 01/13/22   Dolores Frame, MD  Tetrahydrozoline HCl (VISINE OP) Place 1 drop into both eyes daily as needed (redness / irritation).    [provider]      Allergies    Penicillins    Review of Systems   Review of Systems  Physical Exam Updated Vital Signs BP (!) 144/80   Pulse (!) 50   Temp 98.2 F (36.8 C) (Oral)   Resp 18   Ht 6' (1.829 m)   Wt 99.8 kg   SpO2 100%   BMI 29.84 kg/m  Physical Exam Constitutional:      General: He is not in acute distress.    Comments: Dry heaving in the room  HENT:     Head: Normocephalic and atraumatic.  Eyes:     Conjunctiva/sclera: Conjunctivae normal.     Pupils: Pupils are equal, round, and reactive to light.  Cardiovascular:     Rate and Rhythm: Regular rhythm. Bradycardia present.  Pulmonary:     Effort: Pulmonary effort is normal. No respiratory distress.  Abdominal:     General: There is no distension.     Tenderness: There is no abdominal tenderness.  Skin:    General: Skin is warm and dry.  Neurological:     General: No focal deficit present.     Mental Status: He is alert. Mental status  is at baseline.  Psychiatric:        Mood and Affect: Mood normal.        Behavior: Behavior normal.     ED Results / Procedures / Treatments   Labs (all labs ordered are listed, but only abnormal results are displayed) Labs Reviewed  COMPREHENSIVE METABOLIC PANEL - Abnormal; Notable for the following components:      Result Value   Potassium 3.3 (*)    Glucose, Bld 165 (*)    Total Protein 8.2 (*)    Total Bilirubin 1.9 (*)    All other components within normal limits  CBC WITH DIFFERENTIAL/PLATELET - Abnormal; Notable for the following components:   RBC 6.07 (*)    Hemoglobin 17.8 (*)    All other components within normal limits  LIPASE, BLOOD  MAGNESIUM  TROPONIN I (HIGH SENSITIVITY)    EKG EKG Interpretation  Date/Time:  Thursday February 13 2022  09:40:05 EDT Ventricular Rate:  48 PR Interval:  168 QRS Duration: 102 QT Interval:  491 QTC Calculation: 439 R Axis:   75 Text Interpretation: Sinus bradycardia No significant change since last tracing Confirmed by Alvester Chou [phone]) on 02/13/2022 1:40:23 PM  Radiology No results found.  Procedures Procedures    Medications Ordered in ED Medications  sodium chloride 0.9 % bolus 1,000 mL (0 mLs Intravenous Stopped 02/13/22 0830)  promethazine (PHENERGAN) 12.5 mg in sodium chloride 0.9 % 50 mL IVPB (0 mg Intravenous Stopped 02/13/22 1134)  famotidine (PEPCID) IVPB 20 mg premix (0 mg Intravenous Stopped 02/13/22 1026)    ED Course/ Medical Decision Making/ A&P Clinical Course as of 02/13/22 1422  Thu Feb 13, 2022  0759 Patient does have some bradycardia here, but was noted to be borderline bradycardic on ECG in the past (HR 50's).  I suspect he may be experiencing some vagal syndrome from the vomiting and straining [MT]  1315 Patient feeling better and attempting p.o. challenge. [MT]  1404 Patient was able to tolerate p.o. and keep down crackers and I was wanting to leave.  I think this is reasonable.  Will prescribe oral and rectal Phenergan, as this appears to be the only antiemetic that works effectively for him, and he has adverse reactions to most of the other ones. [MT]    Clinical Course User Index [MT] Camrin Gearheart, Kermit Balo, MD                           Medical Decision Making Amount and/or Complexity of Data Reviewed Labs: ordered. ECG/medicine tests: ordered.  Risk Prescription drug management.   This patient presents to the ED with concern for epigastric pain, nausea vomiting. This involves an extensive number of treatment options, and is a complaint that carries with it a high risk of complications and morbidity.  The differential diagnosis includes gastritis versus reaction to the bowel prep versus acute biliary disease versus peptic ulcer versus  other  Co-morbidities that complicate the patient evaluation: History of gastric ulcers  I ordered and personally interpreted labs.  The pertinent results include: CT abdomen pelvis with contrast from January 2022 showed no significant abnormal findings.  Normal gallbladder.  The patient was maintained on a cardiac monitor.  I personally viewed and interpreted the cardiac monitored which showed an underlying rhythm of: Sinus bradycardia and normal sinus rhythm  Per my interpretation the patient's ECG shows sinus bradycardia with no acute ischemic findings  I ordered medication including for nausea, suspected  gastritis  Test Considered: Overall my suspicion for acute intra-abdominal infectious or inflammatory process quite low, not feel the needed CT imaging of the abdomen.  This is more consistent with a reaction to his bowel prep, or gastroparesis or cyclical vomiting.  After the interventions noted above, I reevaluated the patient and found that they have: improved   Dispostion:  After consideration of the diagnostic results and the patients response to treatment, I feel that the patent would benefit from outpatient follow-up.         Final Clinical Impression(s) / ED Diagnoses Final diagnoses:  Nausea and vomiting, unspecified vomiting type    Rx / DC Orders ED Discharge Orders          Ordered    promethazine (PHENERGAN) 25 MG tablet  Every 6 hours PRN        02/13/22 1406    promethazine (PHENERGAN) 25 MG suppository  Every 6 hours PRN        02/13/22 1406              Terald Sleeper, MD 02/13/22 1423

## 2022-02-13 NOTE — ED Triage Notes (Signed)
Pt c/o vomiting and feeling dehydrated since early this morning. Pt was supposed to be having an EGD & colonoscopy this morning. He drink half the prep yesterday and did fine with it. He woke up this morning to drink the other half of the prep and started vomiting. He said he hasn't been able to stop vomiting since. He went to have his procedure done this morning and they sent him to the ED for further evaluation.

## 2022-02-13 NOTE — ED Notes (Signed)
Pt reports significant improvement in his symptoms, HR noted to have improved.

## 2022-02-13 NOTE — Discharge Instructions (Signed)
Please call your GI office to talk about your visit to the ER today.  They may need to reschedule and replan your colonoscopy and endoscopy.  Stick to a very light diet for the next 2 days, with a lot of fluids, Gatorade, saltines and very bland, light food.  Avoid very sweet, very hot, and very spicy foods.  Avoid meat and bread.

## 2022-02-18 ENCOUNTER — Other Ambulatory Visit (INDEPENDENT_AMBULATORY_CARE_PROVIDER_SITE_OTHER): Payer: Self-pay | Admitting: Gastroenterology

## 2022-02-18 DIAGNOSIS — R112 Nausea with vomiting, unspecified: Secondary | ICD-10-CM

## 2022-02-18 MED ORDER — ONDANSETRON HCL 4 MG PO TABS: 4.0000 mg | ORAL_TABLET | Freq: Three times a day (TID) | ORAL | 1 refills | Status: DC | PRN

## 2022-02-18 NOTE — Telephone Encounter (Signed)
Patient states since February 13, 2022 when he was prepping for procedure with the oral prep he has been sick. On 02/13/2022 patient showed up for pre op and says he was so sick they referred him to the Ed. He says they gave him fluids and phenergan tablets and suppositories he did not pick up the suppositories,but is taking his oral phenergan prn and his Protonix. He say he is still having a decreased appetite and left mid abdominal pains, and occasional vomiting. He is staying hydrated and is able to urinate He says he has a history of ulcers. I advised there was no physician in the office today and no availability for an appointment this week, but I would make the Doctor aware and if (he) Patient worsens he needs to go to the nearest ED. I advised we could place him on a cancellation list.  I also advise he try to eat a bland soft diet.He states understanding, If any medications to be sent in for the patient he would like for it to be sent to [facility]. Please advise.

## 2022-02-20 ENCOUNTER — Other Ambulatory Visit (INDEPENDENT_AMBULATORY_CARE_PROVIDER_SITE_OTHER): Payer: Self-pay

## 2022-02-20 ENCOUNTER — Encounter (INDEPENDENT_AMBULATORY_CARE_PROVIDER_SITE_OTHER): Payer: Self-pay

## 2022-02-20 DIAGNOSIS — R112 Nausea with vomiting, unspecified: Secondary | ICD-10-CM

## 2022-02-20 DIAGNOSIS — R109 Unspecified abdominal pain: Secondary | ICD-10-CM

## 2022-02-27 ENCOUNTER — Encounter (HOSPITAL_COMMUNITY): Payer: Self-pay | Admitting: Anesthesiology

## 2022-02-28 ENCOUNTER — Ambulatory Visit (HOSPITAL_COMMUNITY): Admission: RE | Admit: 2022-02-28 | Payer: Self-pay | Source: Home / Self Care | Admitting: Gastroenterology

## 2022-02-28 ENCOUNTER — Encounter (HOSPITAL_COMMUNITY): Admission: RE | Payer: Self-pay | Source: Home / Self Care

## 2022-02-28 DIAGNOSIS — R1012 Left upper quadrant pain: Secondary | ICD-10-CM

## 2022-02-28 DIAGNOSIS — R112 Nausea with vomiting, unspecified: Secondary | ICD-10-CM

## 2022-02-28 SURGERY — ESOPHAGOGASTRODUODENOSCOPY (EGD) WITH PROPOFOL
Anesthesia: Monitor Anesthesia Care

## 2022-02-28 NOTE — Telephone Encounter (Signed)
Left message to return call to get more information 

## 2022-04-07 ENCOUNTER — Telehealth (INDEPENDENT_AMBULATORY_CARE_PROVIDER_SITE_OTHER): Payer: Self-pay | Admitting: *Deleted

## 2022-04-07 ENCOUNTER — Encounter (INDEPENDENT_AMBULATORY_CARE_PROVIDER_SITE_OTHER): Payer: Self-pay

## 2022-04-07 ENCOUNTER — Telehealth (INDEPENDENT_AMBULATORY_CARE_PROVIDER_SITE_OTHER): Payer: Self-pay | Admitting: Gastroenterology

## 2022-04-07 DIAGNOSIS — R112 Nausea with vomiting, unspecified: Secondary | ICD-10-CM

## 2022-04-07 NOTE — Progress Notes (Deleted)
Primary Care Physician:  Ivy Lynn, NP  Primary GI: Jenetta Downer   Patient Location: Home   Provider Location: [facility]   Reason for Visit: follow up of GERD/nausea/vomiting/diarrhea   Persons present on the virtual encounter, with roles: George Nanna L. Alys Dulak, MSN, APRN, AGNP-C   Total time (minutes) spent on medical discussion: 10 minutes  Virtual Visit via MyChart Video Note visit is conducted virtually and was requested by patient.   I connected with George Newton on 04/07/22 at  2:30 PM EDT by telephone and verified that I am speaking with the correct person using two identifiers.   I discussed the limitations, risks, security and privacy concerns of performing an evaluation and management service by telephone and the availability of in person appointments. I also discussed with the patient that there may be a patient responsible charge related to this service. The patient expressed understanding and agreed to proceed.  Chief Complaint  Patient presents with   Gastroesophageal Reflux    Patient doing a mychart visit today to follow up on GERD. Has stopped dicyclomine and zofran due to meds not helping. Protonix does help.    History of Present Illness: George Newton is a 46 y.o. male with past medical history of renal stones  Last seen 01/13/22 for attacks of n/v/d, seen at [facility] and given protonix, no flares since that, having intermittent LUQ pain. No previous colonoscopy  Recommended to have EGD and colonoscopy, Rx bentyl 10mg  TID PRN, continue protonix and avoid spicy/greasy foods.  EGD and Colonoscopy were not completed   Present:   Last Colonoscopy; Last EGD:  Past Medical History:  Diagnosis Date   Gastric ulcer    GERD (gastroesophageal reflux disease)    History of kidney stones      Past Surgical History:  Procedure Laterality Date   APPENDECTOMY     CYSTOSCOPY W/ URETERAL STENT PLACEMENT     shoulder Left    SHOULDER SURGERY     left  shoulder   SHOULDER SURGERY Left 2008     Current Meds  Medication Sig   acetaminophen (TYLENOL) 500 MG tablet Take 500 mg by mouth every 6 (six) hours as needed for moderate pain.   cetirizine (ZYRTEC) 10 MG tablet Take 10 mg by mouth daily as needed for allergies.   pantoprazole (PROTONIX) 40 MG tablet Take 1 tablet (40 mg total) by mouth daily.   promethazine (PHENERGAN) 25 MG suppository Place 1 suppository (25 mg total) rectally every 6 (six) hours as needed for up to 6 days for nausea or vomiting.   promethazine (PHENERGAN) 25 MG tablet Take 1 tablet (25 mg total) by mouth every 6 (six) hours as needed for up to 6 doses for nausea or vomiting.   Tetrahydrozoline HCl (VISINE OP) Place 1 drop into both eyes daily as needed (redness / irritation).     Family History  Problem Relation Age of Onset   Cancer Mother    Stroke Mother    Heart disease Father    ADD / ADHD Son    Asthma Son    Kidney disease Maternal Grandmother     Social History   Socioeconomic History   Marital status: Married    Spouse name: merisa   Number of children: 1   Years of education: Not on file   Highest education level: Not on file  Occupational History   Not on file  Tobacco Use   Smoking status: Former    Types: Cigarettes  Quit date: 2020    Years since quitting: 3.7    Passive exposure: Past   Smokeless tobacco: Former    Types: Chew    Quit date: 2021  Vaping Use   Vaping Use: Never used  Substance and Sexual Activity   Alcohol use: Not Currently   Drug use: Yes    Types: Marijuana   Sexual activity: Yes  Other Topics Concern   Not on file  Social History Narrative   Not on file   Social Determinants of Health   Financial Resource Strain: Not on file  Food Insecurity: Not on file  Transportation Needs: Not on file  Physical Activity: Not on file  Stress: Not on file  Social Connections: Not on file      Review of Systems: Gen: Denies fever, chills, anorexia.  Denies fatigue, weakness, weight loss.  CV: Denies chest pain, palpitations, syncope, peripheral edema, and claudication. Resp: Denies dyspnea at rest, cough, wheezing, coughing up blood, and pleurisy. GI: see HPI Derm: Denies rash, itching, dry skin Psych: Denies depression, anxiety, memory loss, confusion. No homicidal or suicidal ideation.  Heme: Denies bruising, bleeding, and enlarged lymph nodes.  Observations/Objective: No distress. Unable to perform physical exam due to telephone encounter. No video available.   Assessment and Plan:   Follow Up Instructions:    I discussed the assessment and treatment plan with the patient. The patient was provided an opportunity to ask questions and all were answered. The patient agreed with the plan and demonstrated an understanding of the instructions.   The patient was advised to call back or seek an in-person evaluation if the symptoms worsen or if the condition fails to improve as anticipated.  I provided *** minutes of face-to-face time during this MyChart Video encounter.  George Rossmann L. Alver Sorrow, MSN, APRN, AGNP-C Adult-Gerontology Nurse Practitioner [facility]

## 2022-04-08 ENCOUNTER — Other Ambulatory Visit (INDEPENDENT_AMBULATORY_CARE_PROVIDER_SITE_OTHER): Payer: Self-pay | Admitting: Gastroenterology

## 2022-04-08 ENCOUNTER — Encounter (INDEPENDENT_AMBULATORY_CARE_PROVIDER_SITE_OTHER): Payer: Self-pay

## 2022-04-08 DIAGNOSIS — K219 Gastro-esophageal reflux disease without esophagitis: Secondary | ICD-10-CM

## 2022-04-08 MED ORDER — PANTOPRAZOLE SODIUM 40 MG PO TBEC: 40.0000 mg | DELAYED_RELEASE_TABLET | Freq: Every day | ORAL | 3 refills | Status: DC

## 2022-04-08 NOTE — Telephone Encounter (Signed)
George Newton, can we get [name] rescheduled for EGD and Colonoscopy as he cancelled previously

## 2022-04-10 NOTE — Telephone Encounter (Signed)
error 

## 2022-04-16 ENCOUNTER — Telehealth (INDEPENDENT_AMBULATORY_CARE_PROVIDER_SITE_OTHER): Payer: Self-pay

## 2022-04-16 NOTE — Telephone Encounter (Signed)
I have left numerous voicemails and a mychart message for George Newton to please call to scheduled his Egd/Tcs and he has not done so at this time

## 2022-04-22 IMAGING — CT CT ABD-PELV W/ CM
2 of 5 series · 16 of 46 positions shown, 18 images · IV contrast (Omnipaque or Isovue)
Comparison: January 02, 2020

CLINICAL DATA: Left abdomen pain with nausea vomiting for 2 weeks.
Assess for abscess.

EXAM:
CT ABDOMEN AND PELVIS WITH CONTRAST
TECHNIQUE: Multidetector CT imaging of the abdomen and pelvis was performed
using the standard protocol following bolus administration of
intravenous contrast.
CONTRAST:  100mL OMNIPAQUE IOHEXOL 300 MG/ML  SOLN

[Series 2: axial st · axial · 0.72mm/px · z∈[-602,-177]mm · 13 of 96 slices shown, 15 images]
[im 6/96  soft-tissue]
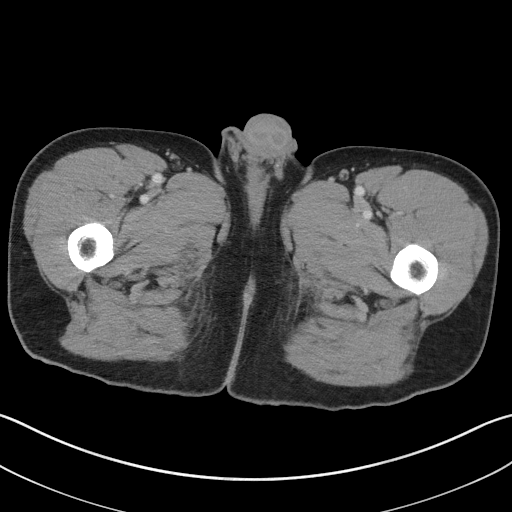
[im 6/96  bone]
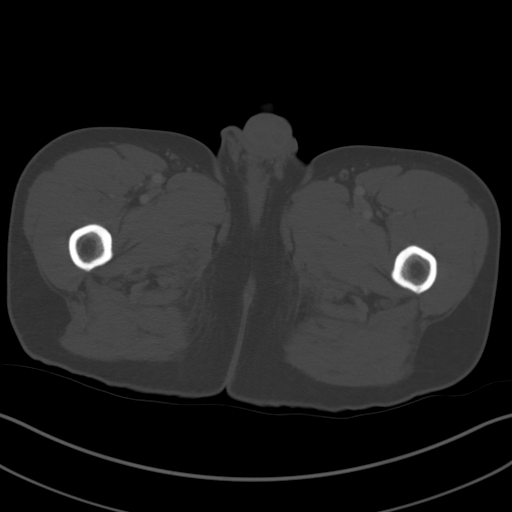
[im 16/96  soft-tissue]
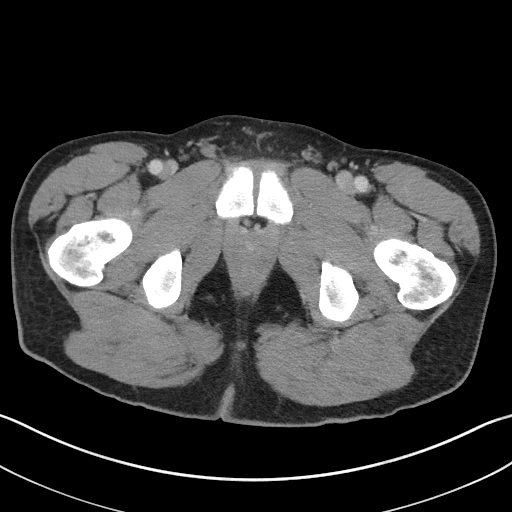
[im 21/96  soft-tissue]
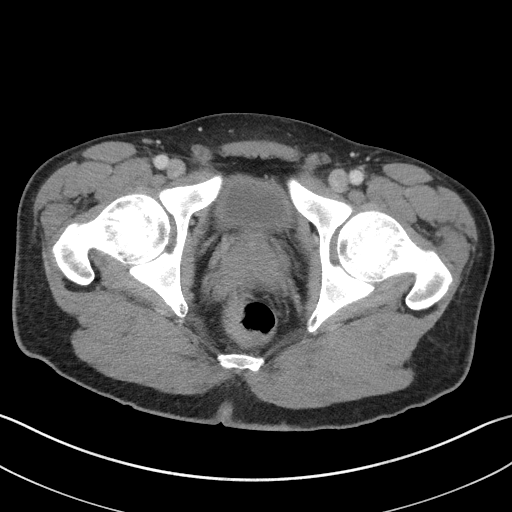
[im 26/96  soft-tissue]
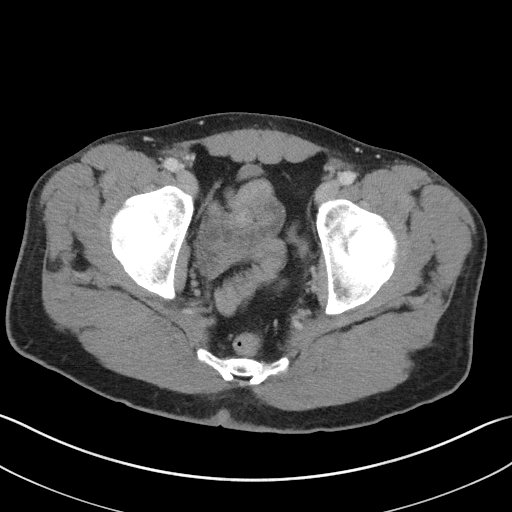
[im 36/96  soft-tissue]
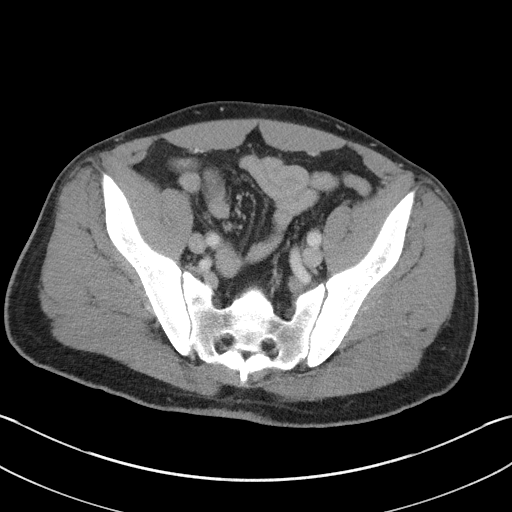
[im 41/96  soft-tissue]
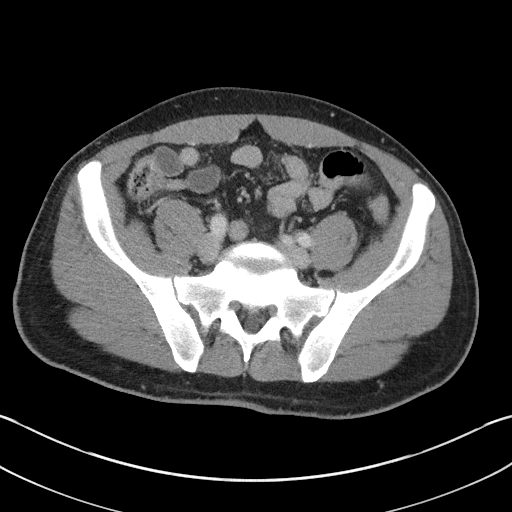
[im 51/96  soft-tissue]
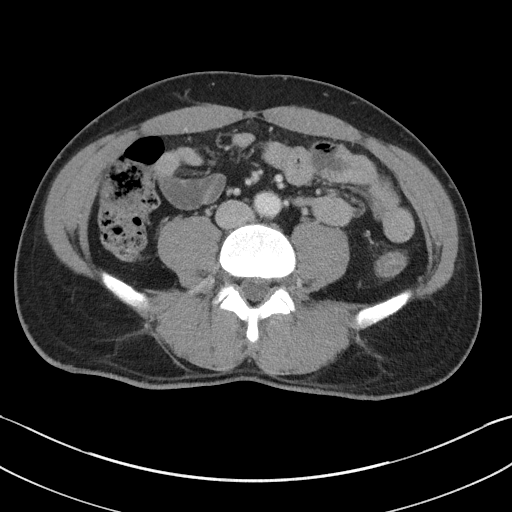
[im 56/96  soft-tissue]
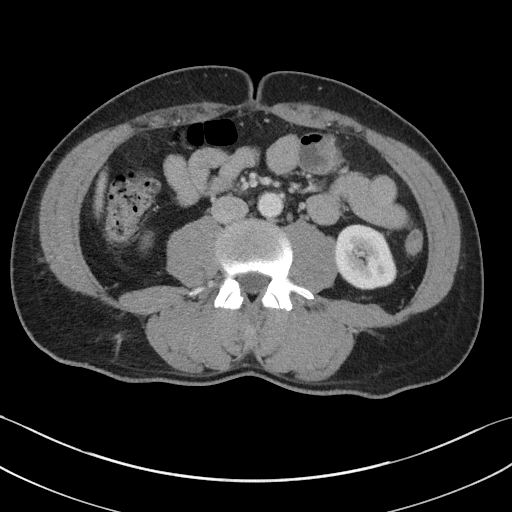
[im 61/96  soft-tissue]
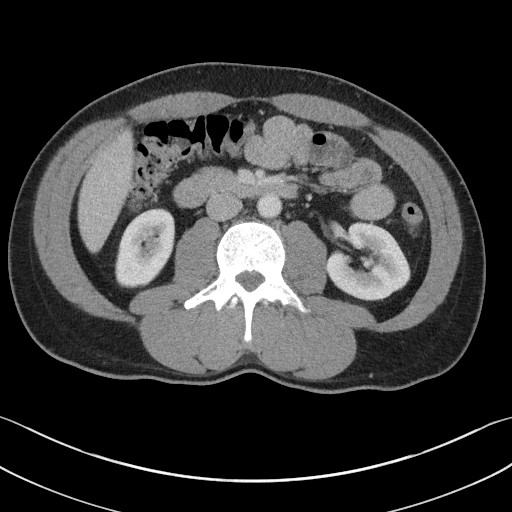
[im 61/96  bone]
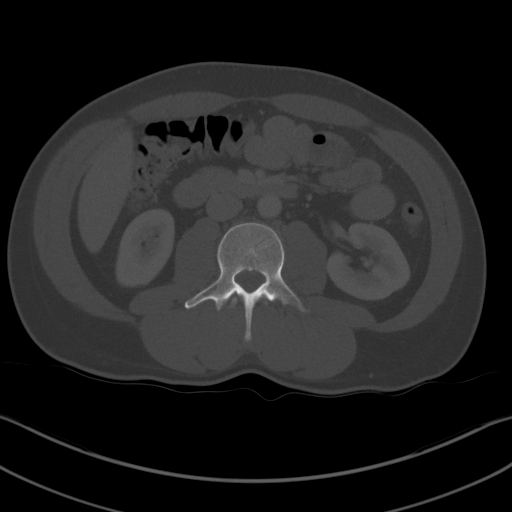
[im 71/96  soft-tissue]
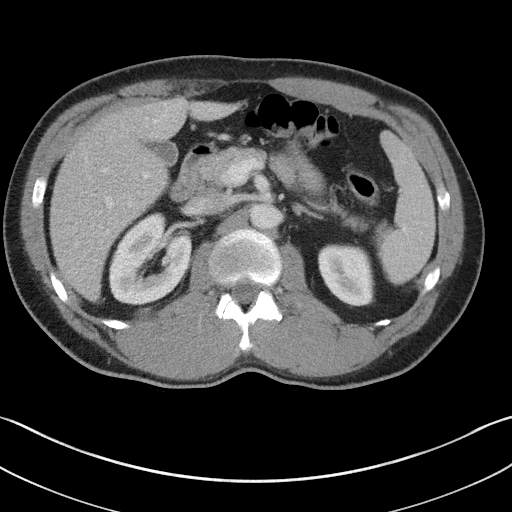
[im 76/96  soft-tissue]
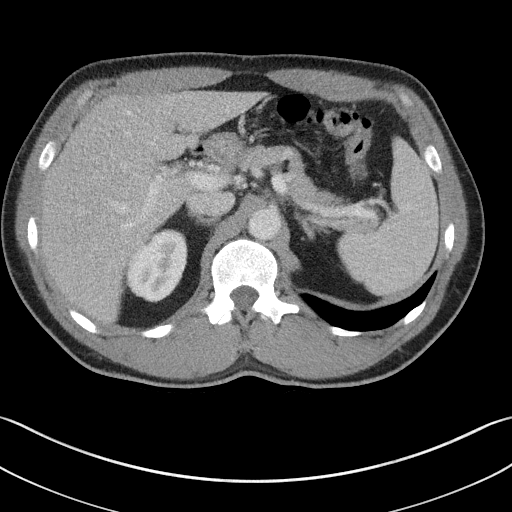
[im 81/96  soft-tissue]
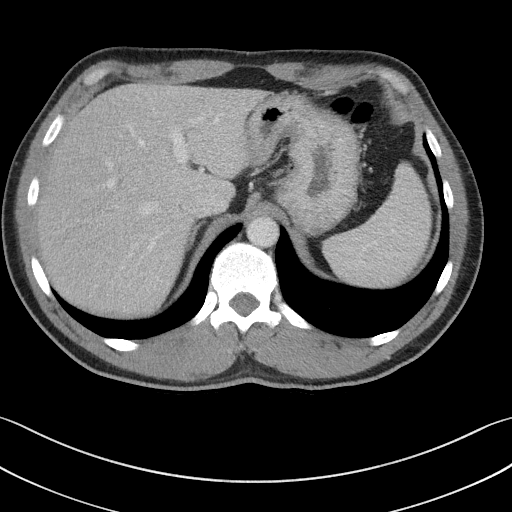
[im 91/96  soft-tissue]
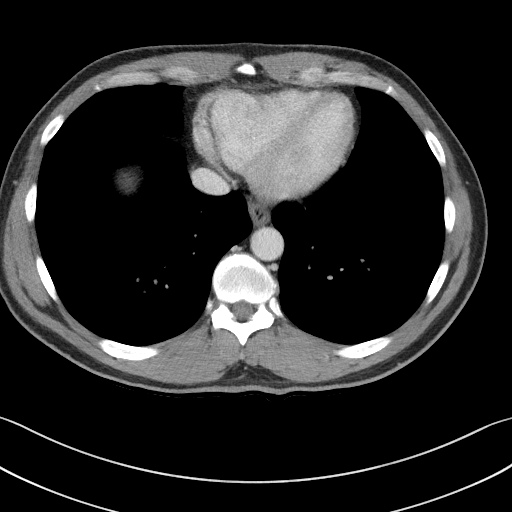

[Series 5: coronal st · coronal · 0.77mm/px · 3 of 105 slices shown]
[im 35/105  soft-tissue]
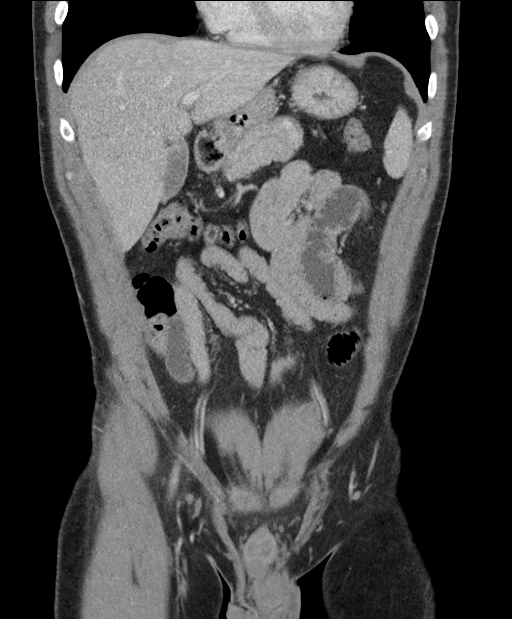
[im 47/105  soft-tissue]
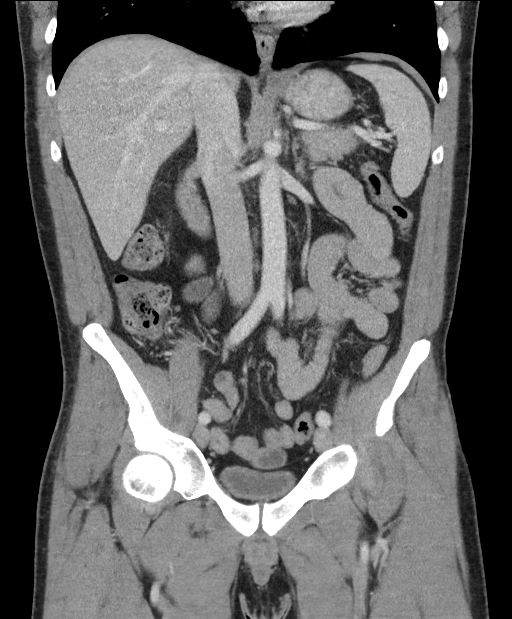
[im 58/105  soft-tissue]
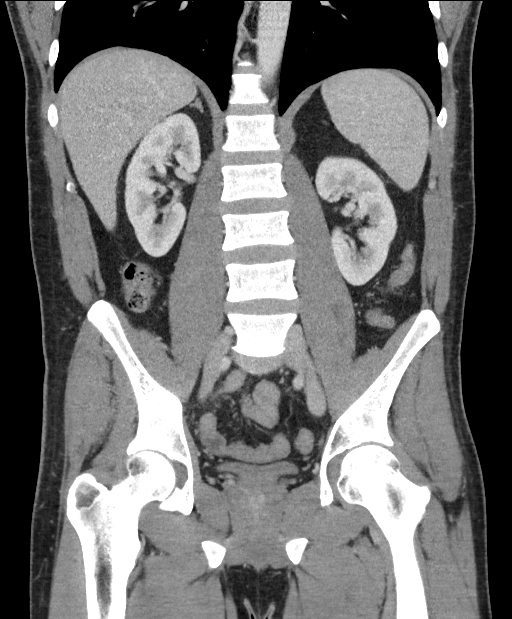

[16 of 46 positions shown; findings below may reference images not displayed]

FINDINGS: Lower chest: No acute abnormality.

Hepatobiliary: No focal liver abnormality is seen. No gallstones,
gallbladder wall thickening, or biliary dilatation.

Pancreas: Unremarkable. No pancreatic ductal dilatation or
surrounding inflammatory changes.

Spleen: Normal in size without focal abnormality.

Adrenals/Urinary Tract: Bilateral adrenal glands are normal. Simple
cyst is identified in the midpole of left kidney. The kidneys are
otherwise normal. There is no hydronephrosis bilaterally. Bladder is
normal.

Stomach/Bowel: Stomach is within normal limits. Appendix appears
normal. No evidence of bowel wall thickening, distention, or
inflammatory changes.

Vascular/Lymphatic: No significant vascular findings are present. No
enlarged abdominal or pelvic lymph nodes.

Reproductive: Prostate calcifications noted.

Other: None.

Musculoskeletal: No acute abnormality.
IMPRESSION: 1. No acute abnormality identified in the abdomen and pelvis. No
evidence of diverticulitis or appendicitis. No abscess is noted.
2. Simple cyst in the left kidney.

## 2022-05-28 ENCOUNTER — Ambulatory Visit: Payer: Self-pay | Admitting: Nurse Practitioner

## 2022-05-28 ENCOUNTER — Encounter: Payer: Self-pay | Admitting: Nurse Practitioner

## 2022-05-28 VITALS — BP 180/98 | HR 93 | Temp 98.7°F | Ht 72.0 in | Wt 193.0 lb

## 2022-05-28 DIAGNOSIS — R1033 Periumbilical pain: Secondary | ICD-10-CM

## 2022-05-28 DIAGNOSIS — R110 Nausea: Secondary | ICD-10-CM

## 2022-05-28 DIAGNOSIS — K219 Gastro-esophageal reflux disease without esophagitis: Secondary | ICD-10-CM

## 2022-05-28 MED ORDER — ACETAMINOPHEN 500 MG PO TABS: 500.0000 mg | ORAL_TABLET | Freq: Four times a day (QID) | ORAL | 1 refills | Status: AC | PRN

## 2022-05-28 MED ORDER — PANTOPRAZOLE SODIUM 40 MG PO PACK: 40.0000 mg | PACK | Freq: Every day | ORAL | 0 refills | Status: DC

## 2022-05-28 NOTE — Patient Instructions (Signed)
Nausea, Adult Nausea is the feeling of having an upset stomach or that you are about to vomit. Nausea on its own is not usually a serious concern, but it may be an early sign of a more serious medical problem. As nausea gets worse, it can lead to vomiting. If vomiting develops, or if you are not able to drink enough fluids, you are at risk of becoming dehydrated. Dehydration can make you tired and thirsty, cause you to have a dry mouth, and decrease how often you urinate. Older adults and people with other diseases or a weak disease-fighting system (immune system) are at higher risk for dehydration. The main goals of treating your nausea are: To relieve your nausea. To limit repeated nausea episodes. To prevent vomiting and dehydration. Follow these instructions at home: Watch your symptoms for any changes. Tell your health care provider about them. Eating and drinking     Take an oral rehydration solution (ORS). This is a drink that is sold at pharmacies and retail stores. Drink clear fluids slowly and in small amounts as you are able. Clear fluids include water, ice chips, low-calorie sports drinks, and fruit juice that has water added (diluted fruit juice). Eat bland, easy-to-digest foods in small amounts as you are able. These foods include bananas, applesauce, rice, lean meats, toast, and crackers. Avoid drinking fluids that contain a lot of sugar or caffeine, such as energy drinks, sports drinks, and soda. Avoid alcohol. Avoid spicy or fatty foods. General instructions Take over-the-counter and prescription medicines only as told by your health care provider. Rest at home while you recover. Drink enough fluid to keep your urine pale yellow. Breathe slowly and deeply when you feel nauseous. Avoid smelling things that have strong odors. Wash your hands often using soap and water for at least 20 seconds. If soap and water are not available, use hand sanitizer. Make sure that everyone in  your household washes their hands well and often. Keep all follow-up visits. This is important. Contact a health care provider if: Your nausea gets worse. Your nausea does not go away after two days. You vomit multiple times. You cannot drink fluids without vomiting. You have any of the following: New symptoms. A fever. A headache. Muscle cramps. A rash. Pain while urinating. You feel light-headed or dizzy. Get help right away if: You have pain in your chest, neck, arm, or jaw. You feel extremely weak or you faint. You have vomit that is bright red or looks like coffee grounds. You have bloody or black stools (feces) or stools that look like tar. You have a severe headache, a stiff neck, or both. You have severe pain, cramping, or bloating in your abdomen. You have difficulty breathing or are breathing very quickly. Your heart is beating very quickly. Your skin feels cold and clammy. You feel confused. You have signs of dehydration, such as: Dark urine, very little urine, or no urine. Cracked lips. Dry mouth. Sunken eyes. Sleepiness. Weakness. These symptoms may be an emergency. Get help right away. Call 911. Do not wait to see if the symptoms will go away. Do not drive yourself to the hospital. Summary Nausea is the feeling that you have an upset stomach or that you are about to vomit. Nausea on its own is not usually a serious concern, but it may be an early sign of a more serious medical problem. If vomiting develops, or if you are not able to drink enough fluids, you are at risk of becoming   dehydrated. Follow recommendations for eating and drinking and take over-the-counter and prescription medicines only as told by your health care provider. Contact a health care provider right away if your symptoms worsen or you have new symptoms. Keep all follow-up visits. This is important. This information is not intended to replace advice given to you by your health care provider.  Make sure you discuss any questions you have with your health care provider. Document Revised: 01/11/2021 Document Reviewed: 01/11/2021 Elsevier Patient Education  2023 [employer]. Abdominal Pain, Adult Many things can cause belly (abdominal) pain. Most times, belly pain is not dangerous. Many cases of belly pain can be watched and treated at home. Sometimes, though, belly pain is serious. Your doctor will try to find the cause of your belly pain. Follow these instructions at home:  Medicines Take over-the-counter and prescription medicines only as told by your doctor. Do not take medicines that help you poop (laxatives) unless told by your doctor. General instructions Watch your belly pain for any changes. Drink enough fluid to keep your pee (urine) pale yellow. Keep all follow-up visits as told by your doctor. This is important. Contact a doctor if: Your belly pain changes or gets worse. You are not hungry, or you lose weight without trying. You are having trouble pooping (constipated) or have watery poop (diarrhea) for more than 2-3 days. You have pain when you pee or poop. Your belly pain wakes you up at night. Your pain gets worse with meals, after eating, or with certain foods. You are vomiting and cannot keep anything down. You have a fever. You have blood in your pee. Get help right away if: Your pain does not go away as soon as your doctor says it should. You cannot stop vomiting. Your pain is only in areas of your belly, such as the right side or the left lower part of the belly. You have bloody or black poop, or poop that looks like tar. You have very bad pain, cramping, or bloating in your belly. You have signs of not having enough fluid or water in your body (dehydration), such as: Dark pee, very little pee, or no pee. Cracked lips. Dry mouth. Sunken eyes. Sleepiness. Weakness. You have trouble breathing or chest pain. Summary Many cases of belly pain can be  watched and treated at home. Watch your belly pain for any changes. Take over-the-counter and prescription medicines only as told by your doctor. Contact a doctor if your belly pain changes or gets worse. Get help right away if you have very bad pain, cramping, or bloating in your belly. This information is not intended to replace advice given to you by your health care provider. Make sure you discuss any questions you have with your health care provider. Document Revised: 11/15/2018 Document Reviewed: 11/15/2018 Elsevier Patient Education  2023 [employer].

## 2022-05-28 NOTE — Assessment & Plan Note (Signed)
Changed Protonix to suspension for 10 days 40 mg. Follow up with GI, follow up with worsening unresolved symptoms.

## 2022-05-28 NOTE — Progress Notes (Signed)
Acute Office Visit  Subjective:     Patient ID: George Newton, male    DOB: 1976-02-26, 46 y.o.   MRN: [id number]  Chief Complaint  Patient presents with   GI Problem    Pt states since halloween he ate a bunch of chocolate he has been hurting really bad     Abdominal Pain This is a recurrent problem. The current episode started in the past 7 days. The onset quality is gradual. The problem occurs constantly. The problem has been unchanged. The pain is located in the generalized abdominal region. The pain is at a severity of 7/10. The pain is moderate. The quality of the pain is aching. The abdominal pain does not radiate. Associated symptoms include nausea and vomiting. Pertinent negatives include no constipation or fever. The pain is aggravated by eating (chocolate). His past medical history is significant for GERD.  Gastroesophageal Reflux He complains of abdominal pain, heartburn and nausea. This is a recurrent problem. The current episode started in the past 7 days. The problem has been unchanged. The symptoms are aggravated by certain foods. There are no known risk factors. He has tried a PPI for the symptoms. The treatment provided mild relief.     Review of Systems  Constitutional: Negative.  Negative for chills and fever.  HENT: Negative.    Cardiovascular: Negative.   Gastrointestinal:  Positive for abdominal pain, heartburn, nausea and vomiting. Negative for blood in stool and constipation.  Genitourinary: Negative.   Skin: Negative.  Negative for itching and rash.  All other systems reviewed and are negative.       Objective:    BP (!) 180/98   Pulse 93   Temp 98.7 F (37.1 C)   Ht 6' (1.829 m)   Wt 193 lb (87.5 kg)   SpO2 99%   BMI 26.18 kg/m  BP Readings from Last 3 Encounters:  05/28/22 (!) 180/98  02/13/22 (!) 164/94  01/13/22 138/83   Wt Readings from Last 3 Encounters:  05/28/22 193 lb (87.5 kg)  02/13/22 220 lb (99.8 kg)  01/13/22 217 lb 6.4 oz  (98.6 kg)      Physical Exam Vitals and nursing note reviewed.  Constitutional:      Appearance: Normal appearance. He is ill-appearing.  HENT:     Right Ear: External ear normal.     Left Ear: External ear normal.     Nose: Nose normal.     Mouth/Throat:     Mouth: Mucous membranes are moist.     Pharynx: Oropharynx is clear.  Eyes:     Conjunctiva/sclera: Conjunctivae normal.  Cardiovascular:     Pulses: Normal pulses.     Heart sounds: Normal heart sounds.  Pulmonary:     Effort: Pulmonary effort is normal.     Breath sounds: Normal breath sounds.  Abdominal:     General: Bowel sounds are normal.  Skin:    General: Skin is warm.     Findings: No erythema or rash.  Neurological:     General: No focal deficit present.     Mental Status: He is alert and oriented to person, place, and time.  Psychiatric:        Behavior: Behavior normal.     No results found for any visits on 05/28/22.      Assessment & Plan:  Patient presents with abdominal pain after eating 45 chocolate candy from Halloween Patient reports that he is not supposed to eat chocolate because it bothers  his stomach. Patient is reporting pain, nausea and cramps, no fever chills, fatigue or body aches. Advised patient to avoid NSAID,and chocolate. Keep GI appointment scheduled for tomorrow 05/29/2022.   Problem List Items Addressed This Visit       Digestive   Chronic GERD    Changed Protonix to suspension for 10 days 40 mg. Follow up with GI, follow up with worsening unresolved symptoms.       Relevant Medications   pantoprazole sodium (PROTONIX) 40 mg   Other Visit Diagnoses     Periumbilical abdominal pain    -  Primary   Nausea           Meds ordered this encounter  Medications   pantoprazole sodium (PROTONIX) 40 mg    Sig: Take 40 mg by mouth daily.    Dispense:  10 packet    Refill:  0    Order Specific Question:   Supervising Provider    Answer:   Standley Brooking    acetaminophen (TYLENOL) 500 MG tablet    Sig: Take 1 tablet (500 mg total) by mouth every 6 (six) hours as needed for moderate pain.    Dispense:  30 tablet    Refill:  1    Order Specific Question:   Supervising Provider    Answer:   Mechele Claude [phone]    Return if symptoms worsen or fail to improve.  Daryll Drown, NP

## 2022-05-29 ENCOUNTER — Telehealth: Payer: Self-pay

## 2022-05-29 ENCOUNTER — Encounter: Payer: Self-pay | Admitting: *Deleted

## 2022-05-29 ENCOUNTER — Other Ambulatory Visit: Payer: Self-pay | Admitting: Gastroenterology

## 2022-05-29 ENCOUNTER — Ambulatory Visit (INDEPENDENT_AMBULATORY_CARE_PROVIDER_SITE_OTHER): Payer: Self-pay | Admitting: Gastroenterology

## 2022-05-29 ENCOUNTER — Encounter: Payer: Self-pay | Admitting: Gastroenterology

## 2022-05-29 VITALS — BP 140/90 | HR 85 | Temp 98.1°F | Ht 72.0 in | Wt 191.2 lb

## 2022-05-29 DIAGNOSIS — R112 Nausea with vomiting, unspecified: Secondary | ICD-10-CM

## 2022-05-29 DIAGNOSIS — R1012 Left upper quadrant pain: Secondary | ICD-10-CM

## 2022-05-29 MED ORDER — PANTOPRAZOLE SODIUM 40 MG PO TBEC: 40.0000 mg | DELAYED_RELEASE_TABLET | Freq: Two times a day (BID) | ORAL | 3 refills | Status: DC

## 2022-05-29 MED ORDER — SUCRALFATE 1 GM/10ML PO SUSP: 1.0000 g | Freq: Four times a day (QID) | ORAL | 1 refills | Status: DC

## 2022-05-29 NOTE — Patient Instructions (Signed)
Increase pantoprazole to twice a day, 30 minutes before eating.  I sent in a medication called Carafate to take four times a day. Let me know if not covered.  I recommend stopping marijuana.  We are arranging an upper endoscopy in the near future!  It was a pleasure to see you today. I want to create trusting relationships with patients to provide genuine, compassionate, and quality care. I value your feedback. If you receive a survey regarding your visit,  I greatly appreciate you taking time to fill this out.   Gelene Mink, PhD, ANP-BC [facility]

## 2022-05-29 NOTE — H&P (View-Only) (Signed)
Gastroenterology Office Note     Primary Care Physician:  Daryll Drown, NP  Primary Gastroenterologist: Dr. Levon Hedger   Chief Complaint   Chief Complaint  Patient presents with   New Patient (Initial Visit)    Nausea and vomiting X 1.5 to 2 weeks     History of Present Illness   George Newton is a 46 y.o. male presenting today in follow-up with a history of GERD, chronic nausea and vomiting.   Last seen in June 2023 by Doylene Bode, NP, with recommendations for colonoscopy/EGD. At last visit he was 217, now 191. He was unable to tolerate the prep for colonoscopy due to vomiting.    Symptoms for about 2 years. Eating bananas right now to try and keep food down. Tried to do the colonoscopy but got sick off of the prep. No prior EGD. Now vomiting daily since Halloween. Saturday felt better and ate but had to vomit starting Sunday again. Vomiting multiple times a day. No hematemesis. LUQ abdominal pain. Pantoprazole once per day. States feels crazy on the Zofran. Taking phenergan for past 5-6 days. No dysphagia. Has seen black stool in the past but not recently. Will have flares.   No NSAIDs. Marijuana multiple times a day. A hot bath will help with symptoms.       Past Medical History:  Diagnosis Date   Gastric ulcer    no prior EGD   GERD (gastroesophageal reflux disease)    History of kidney stones     Past Surgical History:  Procedure Laterality Date   APPENDECTOMY     CYSTOSCOPY W/ URETERAL STENT PLACEMENT     shoulder Left    SHOULDER SURGERY     left shoulder   SHOULDER SURGERY Left 2008    Current Outpatient Medications  Medication Sig Dispense Refill   acetaminophen (TYLENOL) 500 MG tablet Take 1 tablet (500 mg total) by mouth every 6 (six) hours as needed for moderate pain. 30 tablet 1   cetirizine (ZYRTEC) 10 MG tablet Take 10 mg by mouth daily as needed for allergies.     pantoprazole (PROTONIX) 40 MG tablet Take 1 tablet (40 mg total) by mouth  2 (two) times daily before a meal. 60 tablet 3   pantoprazole sodium (PROTONIX) 40 mg Take 40 mg by mouth daily. 10 packet 0   promethazine (PHENERGAN) 25 MG tablet Take 1 tablet (25 mg total) by mouth every 6 (six) hours as needed for up to 6 doses for nausea or vomiting. 6 tablet 0   sucralfate (CARAFATE) 1 GM/10ML suspension Take 10 mLs (1 g total) by mouth 4 (four) times daily. 420 mL 1   promethazine (PHENERGAN) 25 MG suppository Place 1 suppository (25 mg total) rectally every 6 (six) hours as needed for up to 6 days for nausea or vomiting. 6 each 0   No current facility-administered medications for this visit.    Allergies as of 05/29/2022 - Review Complete 05/29/2022  Allergen Reaction Noted   Penicillins Rash 04/12/2020    Family History  Problem Relation Age of Onset   Cancer Mother    Stroke Mother    Heart disease Father    ADD / ADHD Son    Asthma Son    Kidney disease Maternal Grandmother     Social History   Socioeconomic History   Marital status: Married    Spouse name: merisa   Number of children: 1   Years of education: Not on file  Highest education level: Not on file  Occupational History   Occupation: Psychologist, forensic  Tobacco Use   Smoking status: Former    Types: Cigarettes    Quit date: 2020    Years since quitting: 3.8    Passive exposure: Past   Smokeless tobacco: Former    Types: Chew    Quit date: 2021  Vaping Use   Vaping Use: Never used  Substance and Sexual Activity   Alcohol use: Not Currently   Drug use: Yes    Types: Marijuana    Comment: few times a day   Sexual activity: Yes  Other Topics Concern   Not on file  Social History Narrative   Not on file   Social Determinants of Health   Financial Resource Strain: Not on file  Food Insecurity: Not on file  Transportation Needs: Not on file  Physical Activity: Not on file  Stress: Not on file  Social Connections: Not on file  Intimate Partner Violence: Not on file      Review of Systems   Gen: Denies any fever, chills, fatigue, weight loss, lack of appetite.  CV: Denies chest pain, heart palpitations, peripheral edema, syncope.  Resp: Denies shortness of breath at rest or with exertion. Denies wheezing or cough.  GI: see HPI GU : Denies urinary burning, urinary frequency, urinary hesitancy MS: Denies joint pain, muscle weakness, cramps, or limitation of movement.  Derm: Denies rash, itching, dry skin Psych: Denies depression, anxiety, memory loss, and confusion Heme: Denies bruising, bleeding, and enlarged lymph nodes.   Physical Exam   BP (!) 174/118   Pulse 85   Temp 98.1 F (36.7 C)   Ht 6' (1.829 m)   Wt 191 lb 3.2 oz (86.7 kg)   BMI 25.93 kg/m  General:   Alert and oriented. Pleasant and cooperative. Well-nourished and well-developed.  Head:  Normocephalic and atraumatic. Eyes:  appears to have mild scleral icterus Cardiac: S1 S2 present without murmurs Lungs: clear bilaterally Abdomen:  +BS, soft, mild TTP LUQ and epigastric, no masses appreciated.  Rectal:  Deferred  Msk:  Symmetrical without gross deformities. Normal posture. Extremities:  Without edema. Neurologic:  Alert and  oriented x4;  grossly normal neurologically. Skin:  Intact without significant lesions or rashes. Psych:  Alert and cooperative. Normal mood and affect.  Lab Results  Component Value Date   WBC 7.8 02/13/2022   HGB 17.8 (H) 02/13/2022   HCT 51.2 02/13/2022   MCV 84.3 02/13/2022   PLT 229 02/13/2022     Assessment   George Newton is a 46 y.o. male presenting today in follow-up with a history of GERD, chronic nausea and vomiting, LUQ abdominal pain.    N/V: with associated LUQ abdominal pain. No prior EGD. Notes intermittent flares, improved with hot baths/showers. Uses marijuana multiple times a day. Highly suspect dealing with cannabinoid hyperemesis syndrome but unable to rule out other etiology, as he does report black stool in the past but  none recently. Increase PPI to BID, continue anti-emetics, avoid marijuana.   Possible scleral icterus on exam: wife states eyes look yellow today. No ETOH use. Check CMP now.     PLAN    Proceed with upper endoscopy by Dr. Marletta Lor in near future: the risks, benefits, and alternatives have been discussed with the patient in detail. The patient states understanding and desires to proceed.  CMP today Possible further imaging depending on labs Increase PPI to BID, add carafate suspension Avoid marijuana  Annitta Needs, PhD, ANP-BC [facility]

## 2022-05-29 NOTE — Progress Notes (Signed)
Gastroenterology Office Note     Primary Care Physician:  Daryll Drown, NP  Primary Gastroenterologist: Dr. Levon Hedger   Chief Complaint   Chief Complaint  Patient presents with   New Patient (Initial Visit)    Nausea and vomiting X 1.5 to 2 weeks     History of Present Illness   George Newton is a 46 y.o. male presenting today in follow-up with a history of GERD, chronic nausea and vomiting.   Last seen in June 2023 by Doylene Bode, NP, with recommendations for colonoscopy/EGD. At last visit he was 217, now 191. He was unable to tolerate the prep for colonoscopy due to vomiting.    Symptoms for about 2 years. Eating bananas right now to try and keep food down. Tried to do the colonoscopy but got sick off of the prep. No prior EGD. Now vomiting daily since Halloween. Saturday felt better and ate but had to vomit starting Sunday again. Vomiting multiple times a day. No hematemesis. LUQ abdominal pain. Pantoprazole once per day. States feels crazy on the Zofran. Taking phenergan for past 5-6 days. No dysphagia. Has seen black stool in the past but not recently. Will have flares.   No NSAIDs. Marijuana multiple times a day. A hot bath will help with symptoms.       Past Medical History:  Diagnosis Date   Gastric ulcer    no prior EGD   GERD (gastroesophageal reflux disease)    History of kidney stones     Past Surgical History:  Procedure Laterality Date   APPENDECTOMY     CYSTOSCOPY W/ URETERAL STENT PLACEMENT     shoulder Left    SHOULDER SURGERY     left shoulder   SHOULDER SURGERY Left 2008    Current Outpatient Medications  Medication Sig Dispense Refill   acetaminophen (TYLENOL) 500 MG tablet Take 1 tablet (500 mg total) by mouth every 6 (six) hours as needed for moderate pain. 30 tablet 1   cetirizine (ZYRTEC) 10 MG tablet Take 10 mg by mouth daily as needed for allergies.     pantoprazole (PROTONIX) 40 MG tablet Take 1 tablet (40 mg total) by mouth  2 (two) times daily before a meal. 60 tablet 3   pantoprazole sodium (PROTONIX) 40 mg Take 40 mg by mouth daily. 10 packet 0   promethazine (PHENERGAN) 25 MG tablet Take 1 tablet (25 mg total) by mouth every 6 (six) hours as needed for up to 6 doses for nausea or vomiting. 6 tablet 0   sucralfate (CARAFATE) 1 GM/10ML suspension Take 10 mLs (1 g total) by mouth 4 (four) times daily. 420 mL 1   promethazine (PHENERGAN) 25 MG suppository Place 1 suppository (25 mg total) rectally every 6 (six) hours as needed for up to 6 days for nausea or vomiting. 6 each 0   No current facility-administered medications for this visit.    Allergies as of 05/29/2022 - Review Complete 05/29/2022  Allergen Reaction Noted   Penicillins Rash 04/12/2020    Family History  Problem Relation Age of Onset   Cancer Mother    Stroke Mother    Heart disease Father    ADD / ADHD Son    Asthma Son    Kidney disease Maternal Grandmother     Social History   Socioeconomic History   Marital status: Married    Spouse name: merisa   Number of children: 1   Years of education: Not on file  Highest education level: Not on file  Occupational History   Occupation: Psychologist, forensic  Tobacco Use   Smoking status: Former    Types: Cigarettes    Quit date: 2020    Years since quitting: 3.8    Passive exposure: Past   Smokeless tobacco: Former    Types: Chew    Quit date: 2021  Vaping Use   Vaping Use: Never used  Substance and Sexual Activity   Alcohol use: Not Currently   Drug use: Yes    Types: Marijuana    Comment: few times a day   Sexual activity: Yes  Other Topics Concern   Not on file  Social History Narrative   Not on file   Social Determinants of Health   Financial Resource Strain: Not on file  Food Insecurity: Not on file  Transportation Needs: Not on file  Physical Activity: Not on file  Stress: Not on file  Social Connections: Not on file  Intimate Partner Violence: Not on file      Review of Systems   Gen: Denies any fever, chills, fatigue, weight loss, lack of appetite.  CV: Denies chest pain, heart palpitations, peripheral edema, syncope.  Resp: Denies shortness of breath at rest or with exertion. Denies wheezing or cough.  GI: see HPI GU : Denies urinary burning, urinary frequency, urinary hesitancy MS: Denies joint pain, muscle weakness, cramps, or limitation of movement.  Derm: Denies rash, itching, dry skin Psych: Denies depression, anxiety, memory loss, and confusion Heme: Denies bruising, bleeding, and enlarged lymph nodes.   Physical Exam   BP (!) 174/118   Pulse 85   Temp 98.1 F (36.7 C)   Ht 6' (1.829 m)   Wt 191 lb 3.2 oz (86.7 kg)   BMI 25.93 kg/m  General:   Alert and oriented. Pleasant and cooperative. Well-nourished and well-developed.  Head:  Normocephalic and atraumatic. Eyes:  appears to have mild scleral icterus Cardiac: S1 S2 present without murmurs Lungs: clear bilaterally Abdomen:  +BS, soft, mild TTP LUQ and epigastric, no masses appreciated.  Rectal:  Deferred  Msk:  Symmetrical without gross deformities. Normal posture. Extremities:  Without edema. Neurologic:  Alert and  oriented x4;  grossly normal neurologically. Skin:  Intact without significant lesions or rashes. Psych:  Alert and cooperative. Normal mood and affect.  Lab Results  Component Value Date   WBC 7.8 02/13/2022   HGB 17.8 (H) 02/13/2022   HCT 51.2 02/13/2022   MCV 84.3 02/13/2022   PLT 229 02/13/2022     Assessment   George Newton is a 46 y.o. male presenting today in follow-up with a history of GERD, chronic nausea and vomiting, LUQ abdominal pain.    N/V: with associated LUQ abdominal pain. No prior EGD. Notes intermittent flares, improved with hot baths/showers. Uses marijuana multiple times a day. Highly suspect dealing with cannabinoid hyperemesis syndrome but unable to rule out other etiology, as he does report black stool in the past but  none recently. Increase PPI to BID, continue anti-emetics, avoid marijuana.   Possible scleral icterus on exam: wife states eyes look yellow today. No ETOH use. Check CMP now.     PLAN    Proceed with upper endoscopy by Dr. Marletta Lor in near future: the risks, benefits, and alternatives have been discussed with the patient in detail. The patient states understanding and desires to proceed.  CMP today Possible further imaging depending on labs Increase PPI to BID, add carafate suspension Avoid marijuana  Annitta Needs, PhD, ANP-BC [facility]

## 2022-05-29 NOTE — Telephone Encounter (Signed)
Pt called back stating he wanted the carafate switched to the pill form because the liquid is $210.00 compared to $20.00. he can be reached @ [phone]. His phone died. Please advise

## 2022-05-30 ENCOUNTER — Other Ambulatory Visit: Payer: Self-pay

## 2022-05-30 LAB — COMPREHENSIVE METABOLIC PANEL
ALT: 11 IU/L (ref 0–44)
AST: 15 IU/L (ref 0–40)
Albumin/Globulin Ratio: 1.9 (ref 1.2–2.2)
Albumin: 5 g/dL (ref 4.1–5.1)
Alkaline Phosphatase: 90 IU/L (ref 44–121)
BUN/Creatinine Ratio: 16 (ref 9–20)
BUN: 17 mg/dL (ref 6–24)
Bilirubin Total: 4 mg/dL — ABNORMAL HIGH (ref 0.0–1.2)
CO2: 20 mmol/L (ref 20–29)
Calcium: 10.2 mg/dL (ref 8.7–10.2)
Chloride: 90 mmol/L — ABNORMAL LOW (ref 96–106)
Creatinine, Ser: 1.09 mg/dL (ref 0.76–1.27)
Globulin, Total: 2.6 g/dL (ref 1.5–4.5)
Glucose: 102 mg/dL — ABNORMAL HIGH (ref 70–99)
Potassium: 3.9 mmol/L (ref 3.5–5.2)
Sodium: 133 mmol/L — ABNORMAL LOW (ref 134–144)
Total Protein: 7.6 g/dL (ref 6.0–8.5)
eGFR: 85 mL/min/{1.73_m2} (ref >59)

## 2022-05-30 LAB — SPECIMEN STATUS REPORT

## 2022-05-30 MED ORDER — SUCRALFATE 1 G PO TABS: 1.0000 g | ORAL_TABLET | Freq: Three times a day (TID) | ORAL | 1 refills | Status: DC

## 2022-05-30 NOTE — Telephone Encounter (Signed)
Pt called back today regarding you sending in the pill form of carafate instead of the liquid. He has sent a MyChart message to you

## 2022-06-02 ENCOUNTER — Other Ambulatory Visit: Payer: Self-pay

## 2022-06-02 DIAGNOSIS — R112 Nausea with vomiting, unspecified: Secondary | ICD-10-CM

## 2022-06-02 DIAGNOSIS — R7989 Other specified abnormal findings of blood chemistry: Secondary | ICD-10-CM

## 2022-06-02 MED ORDER — PROMETHAZINE HCL 25 MG PO TABS: 25.0000 mg | ORAL_TABLET | Freq: Four times a day (QID) | ORAL | 1 refills | Status: AC | PRN

## 2022-06-02 MED ORDER — LACTATED RINGERS IV SOLN: INTRAVENOUS | Status: DC

## 2022-06-06 LAB — HEPATIC FUNCTION PANEL
ALT: 15 IU/L (ref 0–44)
AST: 24 IU/L (ref 0–40)
Albumin: 4.1 g/dL (ref 4.1–5.1)
Alkaline Phosphatase: 65 IU/L (ref 44–121)
Bilirubin Total: 0.5 mg/dL (ref 0.0–1.2)
Bilirubin, Direct: 0.23 mg/dL (ref 0.00–0.40)
Total Protein: 6.3 g/dL (ref 6.0–8.5)

## 2022-06-08 ENCOUNTER — Encounter (INDEPENDENT_AMBULATORY_CARE_PROVIDER_SITE_OTHER): Payer: Self-pay | Admitting: Gastroenterology

## 2022-06-10 ENCOUNTER — Other Ambulatory Visit: Payer: Self-pay

## 2022-06-10 ENCOUNTER — Encounter (HOSPITAL_COMMUNITY)
Admission: RE | Disposition: A | Discharge status: Home / Self Care | Payer: Self-pay | Source: Home / Self Care | Attending: Internal Medicine

## 2022-06-10 ENCOUNTER — Ambulatory Visit (HOSPITAL_COMMUNITY)
Admission: RE | Admit: 2022-06-10 | Discharge: 2022-06-10 | Disposition: A | Discharge status: Home / Self Care | Payer: Self-pay | Attending: Internal Medicine | Admitting: Internal Medicine

## 2022-06-10 ENCOUNTER — Ambulatory Visit (HOSPITAL_BASED_OUTPATIENT_CLINIC_OR_DEPARTMENT_OTHER): Payer: Self-pay | Admitting: Certified Registered"

## 2022-06-10 ENCOUNTER — Ambulatory Visit (HOSPITAL_COMMUNITY): Payer: Self-pay | Admitting: Certified Registered"

## 2022-06-10 ENCOUNTER — Encounter (HOSPITAL_COMMUNITY): Payer: Self-pay

## 2022-06-10 DIAGNOSIS — R1012 Left upper quadrant pain: Secondary | ICD-10-CM | POA: Insufficient documentation

## 2022-06-10 DIAGNOSIS — Z87891 Personal history of nicotine dependence: Secondary | ICD-10-CM | POA: Insufficient documentation

## 2022-06-10 DIAGNOSIS — B9681 Helicobacter pylori [H. pylori] as the cause of diseases classified elsewhere: Secondary | ICD-10-CM | POA: Insufficient documentation

## 2022-06-10 DIAGNOSIS — K2970 Gastritis, unspecified, without bleeding: Secondary | ICD-10-CM

## 2022-06-10 DIAGNOSIS — K219 Gastro-esophageal reflux disease without esophagitis: Secondary | ICD-10-CM | POA: Insufficient documentation

## 2022-06-10 DIAGNOSIS — R112 Nausea with vomiting, unspecified: Secondary | ICD-10-CM | POA: Insufficient documentation

## 2022-06-10 DIAGNOSIS — Z79899 Other long term (current) drug therapy: Secondary | ICD-10-CM | POA: Insufficient documentation

## 2022-06-10 HISTORY — PX: BIOPSY: SHX5522

## 2022-06-10 HISTORY — PX: ESOPHAGOGASTRODUODENOSCOPY (EGD) WITH PROPOFOL: SHX5813

## 2022-06-10 SURGERY — ESOPHAGOGASTRODUODENOSCOPY (EGD) WITH PROPOFOL
Anesthesia: General

## 2022-06-10 MED ORDER — DEXMEDETOMIDINE HCL IN NACL 80 MCG/20ML IV SOLN
INTRAVENOUS | Status: DC | PRN
  Administered 2022-06-10: 4 ug via BUCCAL
  Administered 2022-06-10: 8 ug via BUCCAL
  Administered 2022-06-10: 4 ug via BUCCAL

## 2022-06-10 MED ORDER — PROPOFOL 10 MG/ML IV BOLUS
INTRAVENOUS | Status: DC | PRN
  Administered 2022-06-10: 20 mg via INTRAVENOUS
  Administered 2022-06-10: 100 mg via INTRAVENOUS

## 2022-06-10 MED ORDER — LACTATED RINGERS IV SOLN: INTRAVENOUS | Status: DC

## 2022-06-10 MED ORDER — LACTATED RINGERS IV SOLN: INTRAVENOUS | Status: DC | PRN

## 2022-06-10 MED ORDER — LIDOCAINE HCL (CARDIAC) PF 100 MG/5ML IV SOSY
PREFILLED_SYRINGE | INTRAVENOUS | Status: DC | PRN
  Administered 2022-06-10: 50 mg via INTRATRACHEAL

## 2022-06-10 NOTE — Transfer of Care (Signed)
Immediate Anesthesia Transfer of Care Note  Patient: George Newton  Procedure(s) Performed: ESOPHAGOGASTRODUODENOSCOPY (EGD) WITH PROPOFOL BIOPSY  Patient Location: Endoscopy Unit  Anesthesia Type:General  Level of Consciousness: awake, alert , oriented, and patient cooperative  Airway & Oxygen Therapy: Patient Spontanous Breathing  Post-op Assessment: Report given to RN, Post -op Vital signs reviewed and stable, and Patient moving all extremities  Post vital signs: Reviewed and stable  Last Vitals:  Vitals Value Taken Time  BP    Temp    Pulse    Resp    SpO2      Last Pain:  Vitals:   06/10/22 0916  TempSrc:   PainSc: 6       Patients Stated Pain Goal: 4 (06/10/22 0756)  Complications: No notable events documented.

## 2022-06-10 NOTE — Op Note (Signed)
[facility] Patient Name: George Newton Procedure Date: 06/10/2022 9:12 AM MRN: [id number] Date of Birth: [birth year] Attending MD: [name] , [place], [id number] CSN: [id number] Age: 46 Admit Type: Outpatient Procedure:                Upper GI endoscopy Indications:              Abdominal pain in the left upper quadrant, Nausea                            with vomiting Providers:                [name], DO, Caprice Kluver, Ladoris Gene                            Technician, Technician Referring MD:             [name], DO Medicines:                See the Anesthesia note for documentation of the                            administered medications Complications:            No immediate complications. Estimated Blood Loss:     Estimated blood loss was minimal. Procedure:                Pre-Anesthesia Assessment:                           - The anesthesia plan was to use monitored                            anesthesia care (MAC).                           After obtaining informed consent, the endoscope was                            passed under direct vision. Throughout the                            procedure, the patient's blood pressure, pulse, and                            oxygen saturations were monitored continuously. The                            GIF-H190 ([id number]) scope was introduced through the                            mouth, and advanced to the second part of duodenum.                            The upper GI endoscopy was accomplished without  difficulty. The patient tolerated the procedure                            well. Scope In: 9:23:08 AM Scope Out: 9:25:51 AM Total Procedure Duration: 0 hours 2 minutes 43 seconds  Findings:      There is no endoscopic evidence of bleeding, areas of erosion,       esophagitis, stenosis or stricture in the entire esophagus.      Patchy mild inflammation characterized by erythema was  found in the       gastric body. Biopsies were taken with a cold forceps for Helicobacter       pylori testing.      A small amount of food (residue) was found in the gastric body.      The duodenal bulb, first portion of the duodenum and second portion of       the duodenum were normal. Impression:               - Gastritis. Biopsied.                           - A small amount of food (residue) in the stomach.                           - Normal duodenal bulb, first portion of the                            duodenum and second portion of the duodenum. Moderate Sedation:      Per Anesthesia Care Recommendation:           - Patient has a contact number available for                            emergencies. The signs and symptoms of potential                            delayed complications were discussed with the                            patient. Return to normal activities tomorrow.                            Written discharge instructions were provided to the                            patient.                           - Resume previous diet.                           - Continue present medications.                           - Await pathology results.                           - Use a proton  pump inhibitor PO BID.                           - Limit marijuana use.                           - Consider GES given small amount of food in                            stomach.                           - Follow up with GI in 2-3 months. Procedure Code(s):        --- Professional ---                           [phone], Esophagogastroduodenoscopy, flexible,                            transoral; with biopsy, single or multiple Diagnosis Code(s):        --- Professional ---                           K29.70, Gastritis, unspecified, without bleeding                           R10.12, Left upper quadrant pain                           R11.2, Nausea with vomiting, unspecified CPT copyright 2022 American  Medical Association. All rights reserved. The codes documented in this report are preliminary and upon coder review may  be revised to meet current compliance requirements. [name], DO [place] Abbey Chatters, DO 06/10/2022 9:40:58 AM This report has been signed electronically. Number of Addenda: 0

## 2022-06-10 NOTE — Discharge Instructions (Addendum)
EGD Discharge instructions Please read the instructions outlined below and refer to this sheet in the next few weeks. These discharge instructions provide you with general information on caring for yourself after you leave the hospital. Your doctor may also give you specific instructions. While your treatment has been planned according to the most current medical practices available, unavoidable complications occasionally occur. If you have any problems or questions after discharge, please call your doctor. ACTIVITY You may resume your regular activity but move at a slower pace for the next 24 hours.  Take frequent rest periods for the next 24 hours.  Walking will help expel (get rid of) the air and reduce the bloated feeling in your abdomen.  No driving for 24 hours (because of the anesthesia (medicine) used during the test).  You may shower.  Do not sign any important legal documents or operate any machinery for 24 hours (because of the anesthesia used during the test).  NUTRITION Drink plenty of fluids.  You may resume your normal diet.  Begin with a light meal and progress to your normal diet.  Avoid alcoholic beverages for 24 hours or as instructed by your caregiver.  MEDICATIONS You may resume your normal medications unless your caregiver tells you otherwise.  WHAT YOU CAN EXPECT TODAY You may experience abdominal discomfort such as a feeling of fullness or "gas" pains.  FOLLOW-UP Your doctor will discuss the results of your test with you.  SEEK IMMEDIATE MEDICAL ATTENTION IF ANY OF THE FOLLOWING OCCUR: Excessive nausea (feeling sick to your stomach) and/or vomiting.  Severe abdominal pain and distention (swelling).  Trouble swallowing.  Temperature over 101 F (37.8 C).  Rectal bleeding or vomiting of blood.    Your EGD revealed mild amount inflammation in your stomach.  I took biopsies of this to rule out infection with a bacteria called H. pylori.  Await pathology results, my  office will contact you.  Your esophagus and small bowel appeared normal.  Continue on pantoprazole twice daily.  Carafate as needed.  You did have a small amount of food in your stomach, you may have a condition called gastroparesis which is delayed emptying of your stomach.  We can discuss further on follow-up visit when you come back to the clinic in 2 to 3 months.  Limit marijuana use.  I hope you have a great rest of your week!  Hennie Duos. Marletta Lor, D.O. Gastroenterology and Hepatology [facility]

## 2022-06-10 NOTE — OR Nursing (Signed)
Dr. Johnnette Litter notified of patient's increased BP, 180's/106-117. No orders received. Will proceed with procedure.

## 2022-06-10 NOTE — Anesthesia Preprocedure Evaluation (Signed)
Anesthesia Evaluation  Patient identified by MRN, date of birth, ID band Patient awake    Reviewed: Allergy & Precautions, H&P , NPO status , Patient's Chart, lab work & pertinent test results, reviewed documented beta blocker date and time   Airway Mallampati: II  TM Distance: >3 FB Neck ROM: full    Dental no notable dental hx.    Pulmonary neg pulmonary ROS, former smoker   Pulmonary exam normal breath sounds clear to auscultation       Cardiovascular Exercise Tolerance: Good hypertension,  Rhythm:regular Rate:Normal     Neuro/Psych negative neurological ROS  negative psych ROS   GI/Hepatic negative GI ROS, Neg liver ROS,,,  Endo/Other  negative endocrine ROS    Renal/GU negative Renal ROS  negative genitourinary   Musculoskeletal   Abdominal   Peds  Hematology negative hematology ROS (+)   Anesthesia Other Findings   Reproductive/Obstetrics negative OB ROS                             Anesthesia Physical Anesthesia Plan  ASA: 2  Anesthesia Plan: General   Post-op Pain Management:    Induction:   PONV Risk Score and Plan: Propofol infusion  Airway Management Planned:   Additional Equipment:   Intra-op Plan:   Post-operative Plan:   Informed Consent: I have reviewed the patients History and Physical, chart, labs and discussed the procedure including the risks, benefits and alternatives for the proposed anesthesia with the patient or authorized representative who has indicated his/her understanding and acceptance.     Dental Advisory Given  Plan Discussed with: CRNA  Anesthesia Plan Comments:        Anesthesia Quick Evaluation

## 2022-06-10 NOTE — Interval H&P Note (Signed)
History and Physical Interval Note:  06/10/2022 8:50 AM  George Newton  has presented today for surgery, with the diagnosis of nausea,vomiting.  The various methods of treatment have been discussed with the patient and family. After consideration of risks, benefits and other options for treatment, the patient has consented to  Procedure(s) with comments: ESOPHAGOGASTRODUODENOSCOPY (EGD) WITH PROPOFOL (N/A) - 2:00 pm, pt knows to arrive at 7:30 as a surgical intervention.  The patient's history has been reviewed, patient examined, no change in status, stable for surgery.  I have reviewed the patient's chart and labs.  Questions were answered to the patient's satisfaction.     Lanelle Bal

## 2022-06-11 NOTE — Anesthesia Postprocedure Evaluation (Signed)
Anesthesia Post Note  Patient: George Newton  Procedure(s) Performed: ESOPHAGOGASTRODUODENOSCOPY (EGD) WITH PROPOFOL BIOPSY  Patient location during evaluation: Phase II Anesthesia Type: General Level of consciousness: awake Pain management: pain level controlled Vital Signs Assessment: post-procedure vital signs reviewed and stable Respiratory status: spontaneous breathing and respiratory function stable Cardiovascular status: blood pressure returned to baseline and stable Postop Assessment: no headache and no apparent nausea or vomiting Anesthetic complications: no Comments: Late entry   No notable events documented.   Last Vitals:  Vitals:   06/10/22 0759 06/10/22 0930  BP: (!) 176/109 (!) 157/94  Pulse:  79  Resp:  18  Temp:  36.9 C  SpO2:  100%    Last Pain:  Vitals:   06/10/22 0930  TempSrc: Oral  PainSc: 0-No pain                 Windell Norfolk

## 2022-06-13 LAB — SURGICAL PATHOLOGY

## 2022-06-16 MED ORDER — BISMUTH/METRONIDAZ/TETRACYCLIN 140-125-125 MG PO CAPS: 3.0000 | ORAL_CAPSULE | Freq: Four times a day (QID) | ORAL | 0 refills | Status: DC

## 2022-06-17 NOTE — Telephone Encounter (Signed)
[name], do we have the ability to get Pylera through patient assistance? Or samples?

## 2022-06-18 ENCOUNTER — Encounter (HOSPITAL_COMMUNITY): Payer: Self-pay | Admitting: Internal Medicine

## 2022-06-18 NOTE — Telephone Encounter (Signed)
We can do paperwork for pt assistance but it will take a little while

## 2022-06-20 NOTE — Telephone Encounter (Signed)
[employer] paperwork filled out for the pt for Pylera. Waiting to hear back from pt on his part of the paperwork.

## 2022-06-25 MED ORDER — AMOXICILLIN 500 MG PO CAPS: 1000.0000 mg | ORAL_CAPSULE | Freq: Two times a day (BID) | ORAL | 0 refills | Status: AC

## 2022-06-25 MED ORDER — CLARITHROMYCIN 500 MG PO TABS: 500.0000 mg | ORAL_TABLET | Freq: Two times a day (BID) | ORAL | 0 refills | Status: AC

## 2022-06-25 NOTE — Telephone Encounter (Signed)
Handled by Lewie Loron, NP

## 2022-06-25 NOTE — Telephone Encounter (Signed)
See other note

## 2022-06-25 NOTE — Addendum Note (Signed)
Addended by: Gelene Mink on: 06/25/2022 08:15 AM   Modules accepted: Orders

## 2022-08-08 ENCOUNTER — Other Ambulatory Visit: Payer: Self-pay | Admitting: Gastroenterology

## 2022-08-08 DIAGNOSIS — K219 Gastro-esophageal reflux disease without esophagitis: Secondary | ICD-10-CM

## 2022-08-19 ENCOUNTER — Telehealth: Payer: Self-pay | Admitting: *Deleted

## 2022-08-19 ENCOUNTER — Ambulatory Visit (INDEPENDENT_AMBULATORY_CARE_PROVIDER_SITE_OTHER): Payer: 59 | Admitting: Gastroenterology

## 2022-08-19 ENCOUNTER — Encounter: Payer: Self-pay | Admitting: Gastroenterology

## 2022-08-19 VITALS — BP 156/109 | HR 80 | Temp 98.4°F | Ht 72.0 in | Wt 199.2 lb

## 2022-08-19 DIAGNOSIS — K2970 Gastritis, unspecified, without bleeding: Secondary | ICD-10-CM

## 2022-08-19 DIAGNOSIS — K219 Gastro-esophageal reflux disease without esophagitis: Secondary | ICD-10-CM | POA: Diagnosis not present

## 2022-08-19 DIAGNOSIS — R030 Elevated blood-pressure reading, without diagnosis of hypertension: Secondary | ICD-10-CM

## 2022-08-19 DIAGNOSIS — B9681 Helicobacter pylori [H. pylori] as the cause of diseases classified elsewhere: Secondary | ICD-10-CM | POA: Diagnosis not present

## 2022-08-19 MED ORDER — CETIRIZINE HCL 10 MG PO TABS: 10.0000 mg | ORAL_TABLET | Freq: Every day | ORAL | 1 refills | Status: DC

## 2022-08-19 MED ORDER — SUCRALFATE 1 G PO TABS: ORAL_TABLET | ORAL | 1 refills | Status: DC

## 2022-08-19 NOTE — Patient Instructions (Addendum)
Stop pantoprazole today. In 2 weeks, you can do the breath test at [place].   Make sure you are fasting one hour before the breath test, no antibiotics within 2 weeks, no pantoprazole, and no pepto bismol or anything with bismuth in it.    After the breath test, you can resume pantoprazole.  We will arrange routine screening colonoscopy.   We will see you in 1 year!  I enjoyed seeing you again today! At our first visit, I mentioned how I value our relationship and want to provide genuine, compassionate, and quality care. You may receive a survey regarding your visit with me, and I welcome your feedback! Thanks so much for taking the time to complete this. I look forward to seeing you again.   Annitta Needs, PhD, ANP-BC [facility]

## 2022-08-19 NOTE — Progress Notes (Signed)
Gastroenterology Office Note     Primary Care Physician:  Ivy Lynn, NP  Primary Gastroenterologist: Dr. Abbey Chatters    Chief Complaint   Chief Complaint  Patient presents with   Post-op Follow-up    Patient here today for a follow up visit from recent Egd done 06/11/2023. He was having issues with nausea, vomiting, and weight loss. He was diagnosed with H Pylori and he has finished his antibiotics. He says he is feeling better as of late.     History of Present Illness   George Newton is a 47 y.o. male presenting today in follow-up with a history of GERD, chronic N/V. Underwent EGD in interim from last appt Nov 2023 with H.pylori gastritis, small amount of food in stomach, normal duodenum. Treated with Amoxicillin and clarithromycin.    Completed abx. Takes Carafate and Protonix. Not taking phenergan as makes sleepy. Symptoms much improved. No abdominal pain, N/V. Able to tolerate diet without difficulty. No overt GI bleeding. No constipation/diarrhea.       Past Medical History:  Diagnosis Date   Gastric ulcer    no prior EGD   GERD (gastroesophageal reflux disease)    History of kidney stones     Past Surgical History:  Procedure Laterality Date   APPENDECTOMY     BIOPSY  06/10/2022   Procedure: BIOPSY;  Surgeon: Eloise Harman, DO;  [facility];  Service: Endoscopy;;   CYSTOSCOPY W/ URETERAL STENT PLACEMENT     ESOPHAGOGASTRODUODENOSCOPY (EGD) WITH PROPOFOL N/A 06/10/2022   H.pylori gastritis, small amount of food in stomach, normal duodenum   shoulder Left    SHOULDER SURGERY     left shoulder   SHOULDER SURGERY Left 2008    Current Outpatient Medications  Medication Sig Dispense Refill   acetaminophen (TYLENOL) 500 MG tablet Take 1 tablet (500 mg total) by mouth every 6 (six) hours as needed for moderate pain. 30 tablet 1   pantoprazole (PROTONIX) 40 MG tablet Take 1 tablet (40 mg total) by mouth 2 (two) times daily before a meal. 60  tablet 3   cetirizine (ZYRTEC) 10 MG tablet Take 1 tablet (10 mg total) by mouth at bedtime. 90 tablet 1   promethazine (PHENERGAN) 25 MG tablet Take 1 tablet (25 mg total) by mouth every 6 (six) hours as needed for up to 6 doses for nausea or vomiting. (Patient not taking: Reported on 08/19/2022) 30 tablet 1   sucralfate (CARAFATE) 1 g tablet TAKE ONE TABLET FOUR TIMES DAILY BEFORE MEALS AND AT BEDTIME 120 tablet 1   No current facility-administered medications for this visit.    Allergies as of 08/19/2022 - Review Complete 08/19/2022  Allergen Reaction Noted   Penicillins Rash 04/12/2020    Family History  Problem Relation Age of Onset   Cancer Mother    Stroke Mother    Heart disease Father    Kidney disease Maternal Grandmother    ADD / ADHD Son    Asthma Son    Colon cancer Neg Hx     Social History   Socioeconomic History   Marital status: Married    Spouse name: merisa   Number of children: 1   Years of education: Not on file   Highest education level: Not on file  Occupational History   Occupation: Gutter Proofreader  Tobacco Use   Smoking status: Former    Types: Cigarettes    Quit date: 2020    Years since quitting: 4.0  Passive exposure: Past   Smokeless tobacco: Former    Types: Chew    Quit date: 2021  Vaping Use   Vaping Use: Never used  Substance and Sexual Activity   Alcohol use: Not Currently   Drug use: Yes    Types: Marijuana    Comment: few times a day   Sexual activity: Yes  Other Topics Concern   Not on file  Social History Narrative   Not on file   Social Determinants of Health   Financial Resource Strain: Not on file  Food Insecurity: Not on file  Transportation Needs: Not on file  Physical Activity: Not on file  Stress: Not on file  Social Connections: Not on file  Intimate Partner Violence: Not on file     Review of Systems   Gen: Denies any fever, chills, fatigue, weight loss, lack of appetite.  CV: Denies chest pain,  heart palpitations, peripheral edema, syncope.  Resp: Denies shortness of breath at rest or with exertion. Denies wheezing or cough.  GI: Denies dysphagia or odynophagia. Denies jaundice, hematemesis, fecal incontinence. GU : Denies urinary burning, urinary frequency, urinary hesitancy MS: Denies joint pain, muscle weakness, cramps, or limitation of movement.  Derm: Denies rash, itching, dry skin Psych: Denies depression, anxiety, memory loss, and confusion Heme: Denies bruising, bleeding, and enlarged lymph nodes.   Physical Exam   BP (!) 156/109 (BP Location: Left Arm, Patient Position: Sitting, Cuff Size: Large)   Pulse 80   Temp 98.4 F (36.9 C) (Temporal)   Ht 6' (1.829 m)   Wt 199 lb 3.2 oz (90.4 kg)   BMI 27.02 kg/m  General:   Alert and oriented. Pleasant and cooperative. Well-nourished and well-developed.  Head:  Normocephalic and atraumatic. Eyes:  Without icterus Abdomen:  +BS, soft, non-tender and non-distended. No HSM noted. No guarding or rebound. No masses appreciated.  Rectal:  Deferred  Msk:  Symmetrical without gross deformities. Normal posture. Extremities:  Without edema. Neurologic:  Alert and  oriented x4;  grossly normal neurologically. Skin:  Intact without significant lesions or rashes. Psych:  Alert and cooperative. Normal mood and affect.   Assessment   George Newton is a 47 y.o. male presenting today in follow-up with a history of  GERD, N/V and abdominal pain, s/p EGD in interim from last visit.  Found to have H.pylori gastritis and treated with amoxicillin and clarithromycin. Symptoms much improved now. Continues on PPI and would like one additional refill of carafate. Will check urea breath test in 2 weeks after off PPI. Notably, he has decreased marijuana use as well, as there had been some concern for underlying cannabinoid hyperemesis syndrome.   Due for routine screening colonoscopy. No concerning lower GI signs/symptoms. Will arrange now.    The patient was found to have elevated blood pressure when vital signs were checked in the office. The blood pressure was rechecked by the nursing staff, and it was found to be persistently elevated >140/90 mmHg. I personally advised the patient to follow up closely with the PCP for hypertension control.   PLAN   Hold PPI X 2 weeks, check urea breath test, then resume PPI One additional refill of Carafate Proceed with colonoscopy by Dr. Abbey Chatters  in near future: the risks, benefits, and alternatives have been discussed with the patient in detail. The patient states understanding and desires to proceed.  1 year follow-up   Annitta Needs, PhD, ANP-BC [facility]

## 2022-08-19 NOTE — Telephone Encounter (Signed)
Pt seen in office. He said he is going to have to get with his boss to see when he can schedule his TCS with Dr. Abbey Chatters, ASA 2. He will call back with some dates.

## 2022-09-06 LAB — H. PYLORI BREATH TEST: H pylori Breath Test: NEGATIVE

## 2022-09-09 NOTE — Progress Notes (Signed)
Noted Pt has viewed his message

## 2022-10-28 ENCOUNTER — Telehealth: Payer: Self-pay | Admitting: Gastroenterology

## 2022-10-28 NOTE — Telephone Encounter (Signed)
Patient called and said that he had been doing really good and was able to come off some of his meds but then he ate to much candy and has been throwing up for a week.  He said he finally stopped and has to go back to work tomorrow.  He wanted an appointment on a Friday and I offered him this Friday but he didn't take it because he was going back to work.  He wanted to see if someone could communicate with him on mychart .Marland KitchenMarland KitchenMarland Kitchen

## 2022-10-28 NOTE — Telephone Encounter (Signed)
Hi George Newton, We are seeing pt's and it is very hard to communicate with you in a timely matter regarding what's going on with. We were advised that you turned down an appt for Friday and that you have been vomiting all week. How many times have you vomited today? Are you dizzy or feeling faint? Once you return the answers to the questions I can forward to [place].     Thanks  [employer]

## 2022-10-29 NOTE — Telephone Encounter (Signed)
Hi, George Newton!     Glad you are feeling better. Make sure you are taking pantoprazole twice a day and the nausea medicine as needed. We probably need an office visit or even could do a virtual visit if that is easier for you. I will ask the schedulers to arrange an appointment for you! Would you like a virtual visit? I send a link to your phone, and you get online that way.

## 2022-10-29 NOTE — Telephone Encounter (Signed)
I'm feeling better .. I wish I could make it Friday but I have to try and work cause I've been out... yes vomiting same symptoms as before but I have my meds ... was seeing if u thought somehow I could still have hpylori somehow and needed to try and schedule a colonoscopy but wanted to talk to Mrs Lissa Hoard or someone about the last time we tried the stuff I had to drink made me severely sick.. I can't have that happen again I just got insurance at work so I need plan this out ... if she can call me ill try and answer or set up a morning appointment next week

## 2022-10-30 NOTE — Telephone Encounter (Signed)
Mandy, please arrange virtual visit with me. Thanks!

## 2022-10-30 NOTE — Telephone Encounter (Signed)
Already taken care of. Pt has an ov scheduled

## 2022-11-25 ENCOUNTER — Encounter: Payer: Self-pay | Admitting: Gastroenterology

## 2022-11-25 ENCOUNTER — Telehealth (INDEPENDENT_AMBULATORY_CARE_PROVIDER_SITE_OTHER): Payer: 59 | Admitting: Gastroenterology

## 2022-11-25 VITALS — Ht 72.0 in | Wt 198.0 lb

## 2022-11-25 DIAGNOSIS — K219 Gastro-esophageal reflux disease without esophagitis: Secondary | ICD-10-CM | POA: Diagnosis not present

## 2022-11-25 DIAGNOSIS — Z1211 Encounter for screening for malignant neoplasm of colon: Secondary | ICD-10-CM | POA: Insufficient documentation

## 2022-11-25 NOTE — Progress Notes (Addendum)
Primary Care Physician:  Daryll Drown, NP (Inactive)  Primary GI: Dr. Levon Hedger  Patient Location: Home   Provider Location: [facility] office   Reason for Visit: Follow-up to arrange colonoscopy    Persons present on the virtual encounter, with roles: Patient and NP   Total time (minutes) spent on medical discussion: 8 minutes   Due to COVID-19, visit was conducted using virtual method.  Visit was requested by patient.  Virtual Visit via MyChart Video Note Due to COVID-19, visit is conducted virtually and was requested by patient.   I connected with George Newton on 11/25/22 at  3:30 PM EDT by video and verified that I am speaking with the correct person using two identifiers.   I discussed the limitations, risks, security and privacy concerns of performing an evaluation and management service by video and the availability of in person appointments. I also discussed with the patient that there may be a patient responsible charge related to this service. The patient expressed understanding and agreed to proceed.  Chief Complaint  Patient presents with   Follow-up    Follow up on abd issues. Pt wants a colonoscopy     History of Present Illness: George Newton  is a pleasant 47 year old male with a history of GERD, chronic N/V. Underwent EGD  Nov 2023 with H.pylori gastritis, small amount of food in stomach, normal duodenum. Treated with Amoxicillin and clarithromycin. H.pylori breath test negative following treatment.    Needs routine screening colonoscopy. Last time tried colonoscopy got really sick with the prep.    Staying away from sugar. Got sick on Easter and [address]. Back on Carafate, PPI. No rectal bleeding. Cut back on marijuana as much as possible. Denies significant nausea. Had quit taking pantoprazole during most recent flare. Notices when not taking PPI, had the episode of N/V. No overt GI bleeding.   No prior colonoscopy. No FH colon cancer.   Past  Medical History:  Diagnosis Date   GERD (gastroesophageal reflux disease)    History of kidney stones      Past Surgical History:  Procedure Laterality Date   APPENDECTOMY     BIOPSY  06/10/2022   Procedure: BIOPSY;  Surgeon: Lanelle Bal, DO;  [facility];  Service: Endoscopy;;   CYSTOSCOPY W/ URETERAL STENT PLACEMENT     ESOPHAGOGASTRODUODENOSCOPY (EGD) WITH PROPOFOL N/A 06/10/2022   H.pylori gastritis, small amount of food in stomach, normal duodenum   shoulder Left    SHOULDER SURGERY     left shoulder   SHOULDER SURGERY Left 2008     Current Meds  Medication Sig   acetaminophen (TYLENOL) 500 MG tablet Take 1 tablet (500 mg total) by mouth every 6 (six) hours as needed for moderate pain.   cetirizine (ZYRTEC) 10 MG tablet Take 1 tablet (10 mg total) by mouth at bedtime.   pantoprazole (PROTONIX) 40 MG tablet Take 1 tablet (40 mg total) by mouth 2 (two) times daily before a meal.   promethazine (PHENERGAN) 25 MG tablet Take 1 tablet (25 mg total) by mouth every 6 (six) hours as needed for up to 6 doses for nausea or vomiting.   sucralfate (CARAFATE) 1 g tablet TAKE ONE TABLET FOUR TIMES DAILY BEFORE MEALS AND AT BEDTIME     Family History  Problem Relation Age of Onset   Cancer Mother    Stroke Mother    Heart disease Father    Kidney disease Maternal Grandmother  ADD / ADHD Son    Asthma Son    Colon cancer Neg Hx     Social History   Socioeconomic History   Marital status: Married    Spouse name: merisa   Number of children: 1   Years of education: Not on file   Highest education level: Not on file  Occupational History   Occupation: Psychologist, forensic  Tobacco Use   Smoking status: Former    Types: Cigarettes    Quit date: 2020    Years since quitting: 4.3    Passive exposure: Past   Smokeless tobacco: Former    Types: Chew    Quit date: 2021  Vaping Use   Vaping Use: Never used  Substance and Sexual Activity   Alcohol use: Not  Currently   Drug use: Yes    Types: Marijuana    Comment: few times a day   Sexual activity: Yes  Other Topics Concern   Not on file  Social History Narrative   Not on file   Social Determinants of Health   Financial Resource Strain: Not on file  Food Insecurity: Not on file  Transportation Needs: Not on file  Physical Activity: Not on file  Stress: Not on file  Social Connections: Not on file       Review of Systems: Gen: Denies fever, chills, anorexia. Denies fatigue, weakness, weight loss.  CV: Denies chest pain, palpitations, syncope, peripheral edema, and claudication. Resp: Denies dyspnea at rest, cough, wheezing, coughing up blood, and pleurisy. GI: see HPI Derm: Denies rash, itching, dry skin Psych: Denies depression, anxiety, memory loss, confusion. No homicidal or suicidal ideation.  Heme: Denies bruising, bleeding, and enlarged lymph nodes.  Observations/Objective: No distress. Unable to perform physical exam due to video encounter.   Assessment and Plan: Pleasant 47 year old male with a history of GERD, chronic N/V, now with need for routine screening colonoscopy.  GERD: doing well on PPI BID. N/V exacerbated by cannabis use, which he has significantly decreased. EGD on file last year with H.pylori gastritis s/p treatment. Eradication has been documented. Noted to have flares if coming off PPI. Recommend decreasing pantoprazole to once daily. If needed, may resume BID. Will try lowest effective dosing that treats symptoms.  Screening colonoscopy: was unable to tolerate prep last time [facility]) and had vomiting. Will attempt Miralax prep. No concerning lower GI signs/symptoms.  Proceed with colonoscopy by Dr. Levon Hedger in near future: the risks, benefits, and alternatives have been discussed with the patient in detail. The patient states understanding and desires to proceed.   Follow Up Instructions:    I discussed the assessment and treatment plan with the  patient. The patient was provided an opportunity to ask questions and all were answered. The patient agreed with the plan and demonstrated an understanding of the instructions.   The patient was advised to call back or seek an in-person evaluation if the symptoms worsen or if the condition fails to improve as anticipated.  I provided 8 minutes of face-to-face time during this MyChart Video encounter.  Gelene Mink, PhD, ANP-BC [facility]   I have reviewed the note and agree with the APP's assessment as described in this progress note  Katrinka Blazing, MD Gastroenterology and Hepatology [facility]

## 2022-11-25 NOTE — Patient Instructions (Signed)
We are arranging a colonoscopy by Dr. Levon Hedger in the near future!  You can decrease pantoprazole to once daily. If needed, you can take it twice a day. Try to take the lowest effective dose that helps manage your symptoms.    I enjoyed seeing you again today! At our first visit, I mentioned how I value our relationship and want to provide genuine, compassionate, and quality care. You may receive a survey regarding your visit with me, and I welcome your feedback! Thanks so much for taking the time to complete this. I look forward to seeing you again.   Gelene Mink, PhD, ANP-BC [facility]enter[facility]

## 2022-12-01 ENCOUNTER — Telehealth: Payer: Self-pay | Admitting: *Deleted

## 2022-12-01 ENCOUNTER — Encounter: Payer: Self-pay | Admitting: *Deleted

## 2022-12-01 NOTE — Telephone Encounter (Signed)
Attempted to call pt x 2. VM not set up. Will mail letter and send MyChart message.

## 2023-02-17 ENCOUNTER — Other Ambulatory Visit: Payer: Self-pay

## 2023-02-17 ENCOUNTER — Other Ambulatory Visit: Payer: Self-pay | Admitting: Gastroenterology

## 2023-02-17 DIAGNOSIS — K219 Gastro-esophageal reflux disease without esophagitis: Secondary | ICD-10-CM

## 2023-02-24 ENCOUNTER — Other Ambulatory Visit: Payer: Self-pay | Admitting: Gastroenterology

## 2023-02-25 NOTE — Telephone Encounter (Signed)
Notified pt through his MyChart and advised of Rx being sent to his pharmacy

## 2023-03-02 ENCOUNTER — Encounter: Payer: Self-pay | Admitting: *Deleted

## 2023-03-10 ENCOUNTER — Encounter (INDEPENDENT_AMBULATORY_CARE_PROVIDER_SITE_OTHER): Payer: Self-pay

## 2023-03-25 ENCOUNTER — Other Ambulatory Visit: Payer: Self-pay | Admitting: Gastroenterology

## 2023-03-25 DIAGNOSIS — K219 Gastro-esophageal reflux disease without esophagitis: Secondary | ICD-10-CM

## 2023-03-26 ENCOUNTER — Encounter (HOSPITAL_COMMUNITY): Payer: Self-pay | Admitting: Anesthesiology

## 2023-03-26 DIAGNOSIS — K219 Gastro-esophageal reflux disease without esophagitis: Secondary | ICD-10-CM

## 2023-03-26 NOTE — Telephone Encounter (Signed)
Hi George Newton has sent in your Pantoprazole and your Carafate.    Thank you and have a great day   [name]

## 2023-03-27 ENCOUNTER — Encounter (HOSPITAL_COMMUNITY): Admission: RE | Payer: Self-pay | Source: Home / Self Care

## 2023-03-27 ENCOUNTER — Ambulatory Visit (HOSPITAL_COMMUNITY): Admission: RE | Admit: 2023-03-27 | Payer: 59 | Source: Home / Self Care | Admitting: Gastroenterology

## 2023-03-27 DIAGNOSIS — Z1211 Encounter for screening for malignant neoplasm of colon: Secondary | ICD-10-CM

## 2023-03-27 SURGERY — COLONOSCOPY WITH PROPOFOL
Anesthesia: Monitor Anesthesia Care

## 2023-04-22 ENCOUNTER — Other Ambulatory Visit: Payer: Self-pay | Admitting: Gastroenterology

## 2023-04-22 DIAGNOSIS — K219 Gastro-esophageal reflux disease without esophagitis: Secondary | ICD-10-CM

## 2023-04-23 MED ORDER — SUCRALFATE 1 G PO TABS
ORAL_TABLET | ORAL | 2 refills | Status: DC
Start: 2023-04-23 — End: 2023-08-17

## 2023-04-30 IMAGING — DX DG CHEST 1V PORT
1 series · 1 of 1 positions shown · non-contrast
Comparison: None.

CLINICAL DATA: Left-sided chest pain

EXAM:
PORTABLE CHEST 1 VIEW

[chest ap]
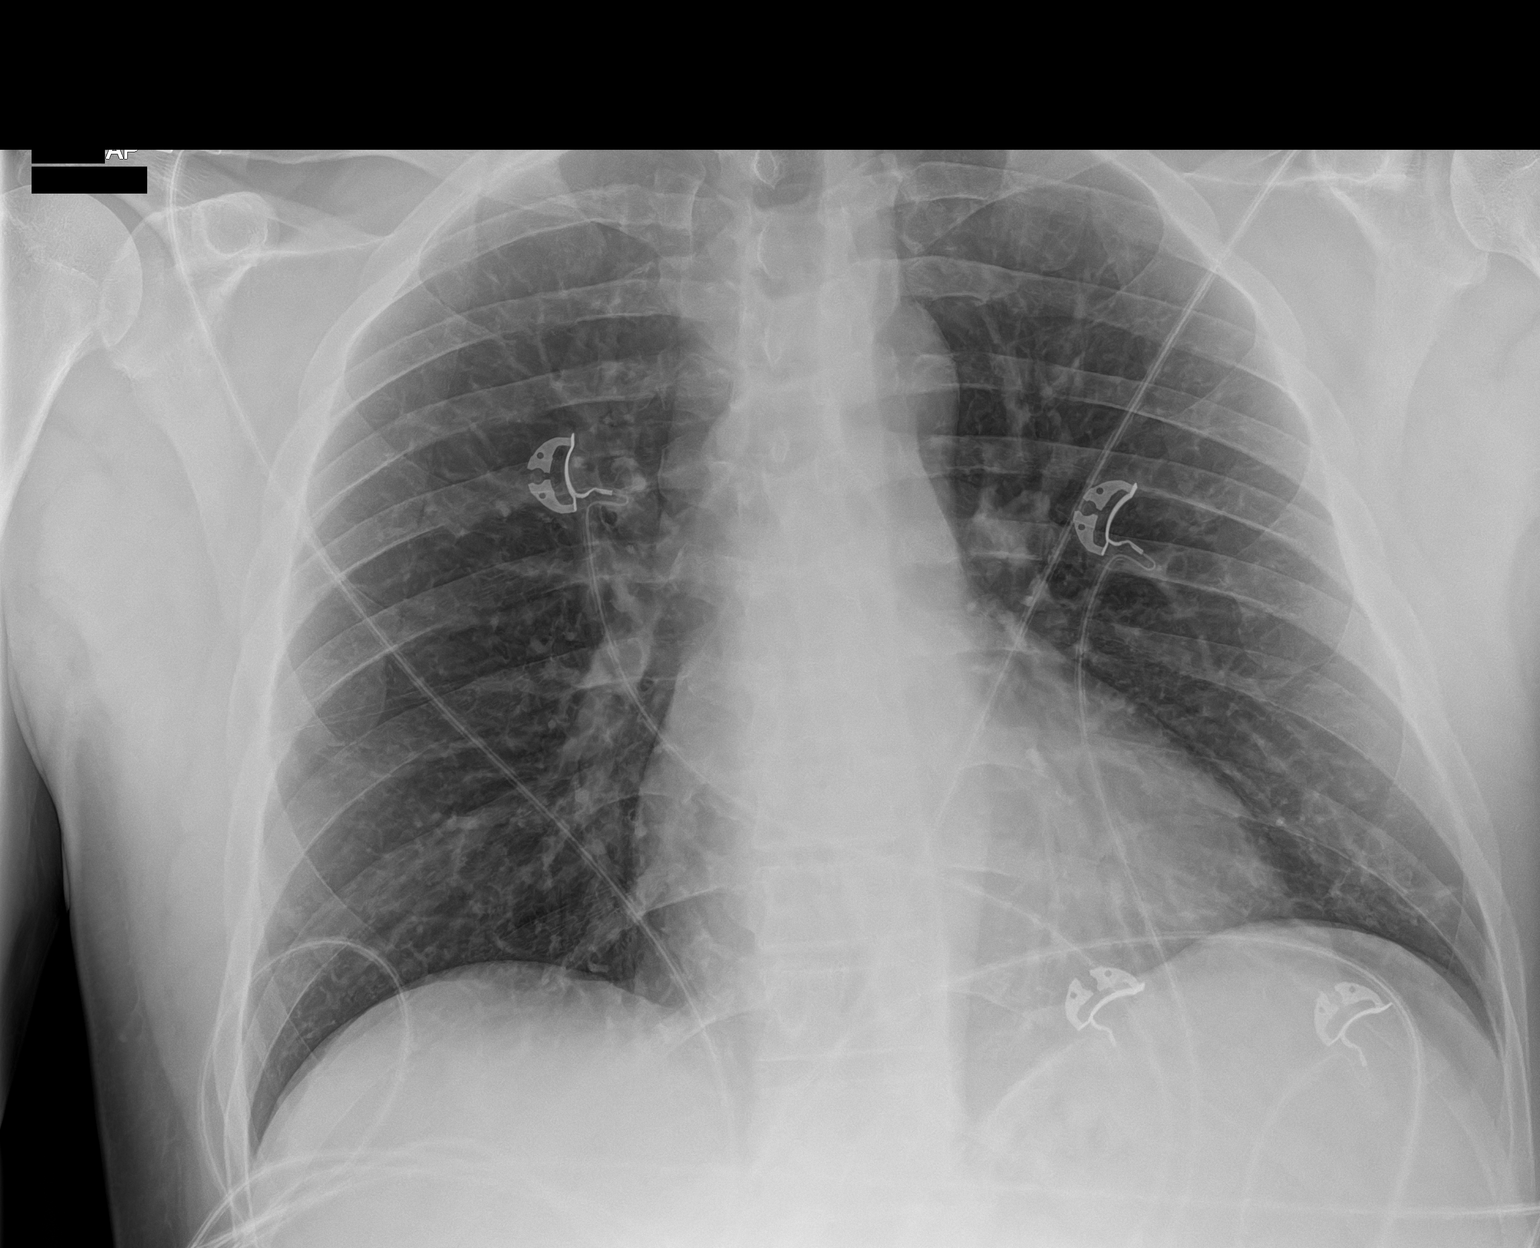

[1 of 1 positions shown; findings below may reference images not displayed]

FINDINGS: The heart size and mediastinal contours are within normal limits. No
focal airspace consolidation, pleural effusion, or pneumothorax. The
visualized skeletal structures are unremarkable.
IMPRESSION: No active disease.

## 2023-05-01 ENCOUNTER — Encounter (HOSPITAL_COMMUNITY): Payer: Self-pay

## 2023-05-01 ENCOUNTER — Emergency Department (HOSPITAL_COMMUNITY)
Admission: EM | Admit: 2023-05-01 | Discharge: 2023-05-02 | Disposition: A | Discharge status: Home / Self Care | Payer: 59 | Attending: Emergency Medicine | Admitting: Emergency Medicine

## 2023-05-01 ENCOUNTER — Other Ambulatory Visit: Payer: Self-pay

## 2023-05-01 ENCOUNTER — Emergency Department (HOSPITAL_COMMUNITY): Payer: 59

## 2023-05-01 DIAGNOSIS — R1012 Left upper quadrant pain: Secondary | ICD-10-CM | POA: Insufficient documentation

## 2023-05-01 DIAGNOSIS — R112 Nausea with vomiting, unspecified: Secondary | ICD-10-CM | POA: Insufficient documentation

## 2023-05-01 DIAGNOSIS — R1032 Left lower quadrant pain: Secondary | ICD-10-CM | POA: Insufficient documentation

## 2023-05-01 DIAGNOSIS — R109 Unspecified abdominal pain: Secondary | ICD-10-CM

## 2023-05-01 LAB — COMPREHENSIVE METABOLIC PANEL
ALT: 16 U/L (ref 0–44)
AST: 17 U/L (ref 15–41)
Albumin: 4.6 g/dL (ref 3.5–5.0)
Alkaline Phosphatase: 65 U/L (ref 38–126)
Anion gap: 12 (ref 5–15)
BUN: 15 mg/dL (ref 6–20)
CO2: 23 mmol/L (ref 22–32)
Calcium: 9.3 mg/dL (ref 8.9–10.3)
Chloride: 94 mmol/L — ABNORMAL LOW (ref 98–111)
Creatinine, Ser: 0.99 mg/dL (ref 0.61–1.24)
GFR, Estimated: >60 mL/min (ref >60)
Glucose, Bld: 129 mg/dL — ABNORMAL HIGH (ref 70–99)
Potassium: 3 mmol/L — ABNORMAL LOW (ref 3.5–5.1)
Sodium: 129 mmol/L — ABNORMAL LOW (ref 135–145)
Total Bilirubin: 4.2 mg/dL — ABNORMAL HIGH (ref 0.3–1.2)
Total Protein: 7.7 g/dL (ref 6.5–8.1)

## 2023-05-01 LAB — CBC WITH DIFFERENTIAL/PLATELET
Abs Immature Granulocytes: 0.02 10*3/uL (ref 0.00–0.07)
Basophils Absolute: 0 10*3/uL (ref 0.0–0.1)
Basophils Relative: 1 %
Eosinophils Absolute: 0.1 10*3/uL (ref 0.0–0.5)
Eosinophils Relative: 1 %
HCT: 51.2 % (ref 39.0–52.0)
Hemoglobin: 18.2 g/dL — ABNORMAL HIGH (ref 13.0–17.0)
Immature Granulocytes: 0 %
Lymphocytes Relative: 27 %
Lymphs Abs: 1.7 10*3/uL (ref 0.7–4.0)
MCH: 29.2 pg (ref 26.0–34.0)
MCHC: 35.5 g/dL (ref 30.0–36.0)
MCV: 82.2 fL (ref 80.0–100.0)
Monocytes Absolute: 0.5 10*3/uL (ref 0.1–1.0)
Monocytes Relative: 8 %
Neutro Abs: 4.1 10*3/uL (ref 1.7–7.7)
Neutrophils Relative %: 63 %
Platelets: 294 10*3/uL (ref 150–400)
RBC: 6.23 MIL/uL — ABNORMAL HIGH (ref 4.22–5.81)
RDW: 11.9 % (ref 11.5–15.5)
WBC: 6.5 10*3/uL (ref 4.0–10.5)
nRBC: 0 % (ref 0.0–0.2)

## 2023-05-01 LAB — URINALYSIS, ROUTINE W REFLEX MICROSCOPIC
Bacteria, UA: NONE SEEN
Bilirubin Urine: NEGATIVE
Glucose, UA: NEGATIVE mg/dL
Hgb urine dipstick: NEGATIVE
Ketones, ur: 20 mg/dL — AB
Leukocytes,Ua: NEGATIVE
Nitrite: NEGATIVE
Protein, ur: 30 mg/dL — AB
Specific Gravity, Urine: 1.024 (ref 1.005–1.030)
pH: 6 (ref 5.0–8.0)

## 2023-05-01 LAB — MAGNESIUM: Magnesium: 2.1 mg/dL (ref 1.7–2.4)

## 2023-05-01 LAB — SAMPLE TO BLOOD BANK

## 2023-05-01 LAB — LIPASE, BLOOD: Lipase: 40 U/L (ref 11–51)

## 2023-05-01 MED ORDER — SODIUM CHLORIDE 0.9 % IV BOLUS
1000.0000 mL | Freq: Once | INTRAVENOUS | Status: AC
  Administered 2023-05-01: 1000 mL via INTRAVENOUS

## 2023-05-01 MED ORDER — ONDANSETRON HCL 4 MG/2ML IJ SOLN
4.0000 mg | Freq: Once | INTRAMUSCULAR | Status: AC
  Administered 2023-05-01: 4 mg via INTRAVENOUS
  Filled 2023-05-01: qty 2

## 2023-05-01 MED ORDER — IOHEXOL 300 MG/ML  SOLN
100.0000 mL | Freq: Once | INTRAMUSCULAR | Status: AC | PRN
  Administered 2023-05-02: 100 mL via INTRAVENOUS

## 2023-05-01 MED ORDER — KETOROLAC TROMETHAMINE 30 MG/ML IJ SOLN
30.0000 mg | Freq: Once | INTRAMUSCULAR | Status: AC
  Administered 2023-05-01: 30 mg via INTRAVENOUS
  Filled 2023-05-01: qty 1

## 2023-05-01 NOTE — ED Triage Notes (Signed)
Brought by EMS for ABD pain and vomiting EMS gave 4mg  zofran, 200cc NS 18g LEFT AC by EMS BGL 139  Per pt, vomiting Started Saturday morning. Complains also of nausea and bloody diarrhea.

## 2023-05-01 NOTE — ED Provider Notes (Signed)
[place] EMERGENCY DEPARTMENT AT [facility] Provider Note   CSN: [id number] Arrival date & time: 05/01/23  1751     History  No chief complaint on file.   George Newton is a 47 y.o. male.  Patient is a 47 year old male with history of GERD, H. pylori.  Patient presenting today for evaluation of nausea, vomiting, and abdominal discomfort.  This has been present for the past week.  George Newton reports being unable to eat or drink anything during this period of time secondary to these complaints.  George Newton tells me George Newton has been having these episodes for the past 2 years.  George Newton has had endoscopy with no conclusive results.  George Newton was also scheduled for colonoscopy, but was unable to tolerate the bowel prep so this was not done.  George Newton denies any bloody stool.  No fevers or chills.  No ill contacts.  The history is provided by the patient.       Home Medications Prior to Admission medications   Medication Sig Start Date End Date Taking? Authorizing Provider  acetaminophen (TYLENOL) 500 MG tablet Take 1 tablet (500 mg total) by mouth every 6 (six) hours as needed for moderate pain. 05/28/22   Daryll Drown, NP  cetirizine (ZYRTEC) 10 MG tablet TAKE ONE TABLET AT BEDTIME 02/25/23   Gelene Mink, NP  pantoprazole (PROTONIX) 40 MG tablet TAKE ONE TABLET TWICE DAILY BEFORE MEALS 03/26/23   Gelene Mink, NP  promethazine (PHENERGAN) 25 MG tablet Take 1 tablet (25 mg total) by mouth every 6 (six) hours as needed for up to 6 doses for nausea or vomiting. 06/02/22   Gelene Mink, NP  sucralfate (CARAFATE) 1 g tablet TAKE ONE TABLET FOUR TIMES DAILY BEFORE MEALS AND AT BEDTIME 04/23/23   Gelene Mink, NP      Allergies    Penicillins    Review of Systems   Review of Systems  All other systems reviewed and are negative.   Physical Exam Updated Vital Signs BP (!) 178/113   Pulse 65   Temp 98.9 F (37.2 C)   Resp 18   Ht 6' (1.829 m)   Wt 88.5 kg   SpO2 93%   BMI 26.45 kg/m  Physical  Exam Vitals and nursing note reviewed.  Constitutional:      General: George Newton is not in acute distress.    Appearance: George Newton is well-developed. George Newton is not diaphoretic.  HENT:     Head: Normocephalic and atraumatic.  Cardiovascular:     Rate and Rhythm: Normal rate and regular rhythm.     Heart sounds: No murmur heard.    No friction rub.  Pulmonary:     Effort: Pulmonary effort is normal. No respiratory distress.     Breath sounds: Normal breath sounds. No wheezing or rales.  Abdominal:     General: Bowel sounds are normal. There is no distension.     Palpations: Abdomen is soft.     Tenderness: There is abdominal tenderness.     Comments: There is tenderness to palpation in the left upper and left lower quadrants.  No rebound or guarding.  Musculoskeletal:        General: Normal range of motion.     Cervical back: Normal range of motion and neck supple.  Skin:    General: Skin is warm and dry.  Neurological:     Mental Status: George Newton is alert and oriented to person, place, and time.  Coordination: Coordination normal.     ED Results / Procedures / Treatments   Labs (all labs ordered are listed, but only abnormal results are displayed) Labs Reviewed  CBC WITH DIFFERENTIAL/PLATELET - Abnormal; Notable for the following components:      Result Value   RBC 6.23 (*)    Hemoglobin 18.2 (*)    All other components within normal limits  COMPREHENSIVE METABOLIC PANEL - Abnormal; Notable for the following components:   Sodium 129 (*)    Potassium 3.0 (*)    Chloride 94 (*)    Glucose, Bld 129 (*)    Total Bilirubin 4.2 (*)    All other components within normal limits  URINALYSIS, ROUTINE W REFLEX MICROSCOPIC - Abnormal; Notable for the following components:   Color, Urine AMBER (*)    Ketones, ur 20 (*)    Protein, ur 30 (*)    All other components within normal limits  LIPASE, BLOOD  MAGNESIUM  POC OCCULT BLOOD, ED  SAMPLE TO BLOOD BANK    EKG None  Radiology No results  found.  Procedures Procedures    Medications Ordered in ED Medications  sodium chloride 0.9 % bolus 1,000 mL (has no administration in time range)  ondansetron (ZOFRAN) injection 4 mg (has no administration in time range)  ketorolac (TORADOL) 30 MG/ML injection 30 mg (has no administration in time range)    ED Course/ Medical Decision Making/ A&P  Patient is a 47 year old male presenting with abdominal pain as described in the HPI.  George Newton arrives here with stable vital signs and is afebrile.  Physical examination reveals left-sided abdominal tenderness, but no peritoneal signs.  Laboratory studies obtained including CBC, CMP, and lipase, all of which are unremarkable.  There is no leukocytosis and no elevation of the liver or pancreatic enzymes.  Urinalysis is clear.  CT scan of the abdomen and pelvis obtained showing no acute findings.  Patient has been hydrated with normal saline and given Toradol for pain and Zofran for nausea.  Upon my reassessment, patient is asleep and appears very comfortable.  I am uncertain as to the etiology of his abdominal discomfort, but nothing appears emergent.  I have agreed to prescribe Zofran and see if this helps with his nausea.  George Newton is to follow-up with primary doctor if not improving.  Final Clinical Impression(s) / ED Diagnoses Final diagnoses:  None    Rx / DC Orders ED Discharge Orders     None         Geoffery Lyons, MD 05/02/23 [phone]

## 2023-05-01 NOTE — ED Notes (Signed)
Pt care taken, complaining of abdominal pain and vomiting. Said he feels dehydrated.

## 2023-05-02 MED ORDER — ONDANSETRON 8 MG PO TBDP: ORAL_TABLET | ORAL | 0 refills | Status: DC

## 2023-05-02 NOTE — Discharge Instructions (Addendum)
Begin taking Zofran as prescribed as needed for nausea.  Take ibuprofen 600 mg every 6 hours as needed for pain.  Follow-up with primary doctor if symptoms are not improving in the next few days.

## 2023-06-03 ENCOUNTER — Telehealth (INDEPENDENT_AMBULATORY_CARE_PROVIDER_SITE_OTHER): Payer: Self-pay | Admitting: Internal Medicine

## 2023-06-03 NOTE — Telephone Encounter (Signed)
Pt left message to schedule TCS. He seen Tobi Bastos 08/19/22 and per Tobi Bastos Proceed with colonoscopy by Dr. Marletta Lor in near future: the risks, benefits, and alternatives have been discussed with the patient in detail. The patient states understanding and desires to proceed.

## 2023-06-03 NOTE — Telephone Encounter (Signed)
Patient will need OV before we can schedule anything. Please schedule thanks!

## 2023-06-25 ENCOUNTER — Ambulatory Visit (INDEPENDENT_AMBULATORY_CARE_PROVIDER_SITE_OTHER): Payer: 59 | Admitting: Gastroenterology

## 2023-06-25 VITALS — BP 140/87 | HR 65 | Temp 98.2°F | Ht 72.0 in | Wt 188.8 lb

## 2023-06-25 DIAGNOSIS — R112 Nausea with vomiting, unspecified: Secondary | ICD-10-CM | POA: Diagnosis not present

## 2023-06-25 DIAGNOSIS — Z1211 Encounter for screening for malignant neoplasm of colon: Secondary | ICD-10-CM

## 2023-06-25 NOTE — Progress Notes (Signed)
Gastroenterology Office Note     Primary Care Physician:  Daryll Drown, NP (Inactive)  Primary Gastroenterologist: Dr. Levon Hedger    Chief Complaint   Chief Complaint  Patient presents with   Colonoscopy    Got sick from prep when doing the first colonoscopy. He for 2nd procedure he got mixed up with the days for the prep.     History of Present Illness   George Newton is a 47 y.o. male presenting today with a history of GERD, chronic N/V. Underwent EGD Nov 2023 with H.pylori gastritis, small amount of food in stomach, normal duodenum. Treated with Amoxicillin and clarithromycin. H.pylori breath test negative following treatment. Here to arrange routine screening colonoscopy. First colonoscopy attempt had vomiting with Trilyte.  He will be doing a MIralax prep. He is doing great now. On probiotic, carafate, pantoprazole once to twice a day. He hasn't need Zofran recently. No constipation. No overt GI bleeding. No abdominal pain. Doing great.   With avoidance of majority of meats, he has felt improvement in N/V. He does believe he had a tick bite in the past.    No prior colonoscopy or FH colon cancer.    Past Medical History:  Diagnosis Date   GERD (gastroesophageal reflux disease)    History of kidney stones     Past Surgical History:  Procedure Laterality Date   APPENDECTOMY     BIOPSY  06/10/2022   Procedure: BIOPSY;  Surgeon: Lanelle Bal, DO;  [facility];  Service: Endoscopy;;   CYSTOSCOPY W/ URETERAL STENT PLACEMENT     ESOPHAGOGASTRODUODENOSCOPY (EGD) WITH PROPOFOL N/A 06/10/2022   H.pylori gastritis, small amount of food in stomach, normal duodenum   shoulder Left    SHOULDER SURGERY     left shoulder   SHOULDER SURGERY Left 2008    Current Outpatient Medications  Medication Sig Dispense Refill   acetaminophen (TYLENOL) 500 MG tablet Take 1 tablet (500 mg total) by mouth every 6 (six) hours as needed for moderate pain. 30 tablet 1    cetirizine (ZYRTEC) 10 MG tablet TAKE ONE TABLET AT BEDTIME 90 tablet 1   ondansetron (ZOFRAN-ODT) 8 MG disintegrating tablet 8mg  ODT q4 hours prn nausea 12 tablet 0   pantoprazole (PROTONIX) 40 MG tablet TAKE ONE TABLET TWICE DAILY BEFORE MEALS 60 tablet 3   sucralfate (CARAFATE) 1 g tablet TAKE ONE TABLET FOUR TIMES DAILY BEFORE MEALS AND AT BEDTIME 120 tablet 2   promethazine (PHENERGAN) 25 MG tablet Take 1 tablet (25 mg total) by mouth every 6 (six) hours as needed for up to 6 doses for nausea or vomiting. (Patient not taking: Reported on 06/25/2023) 30 tablet 1   No current facility-administered medications for this visit.    Allergies as of 06/25/2023 - Review Complete 06/25/2023  Allergen Reaction Noted   Penicillins Rash 04/12/2020    Family History  Problem Relation Age of Onset   Cancer Mother    Stroke Mother    Heart disease Father    Kidney disease Maternal Grandmother    ADD / ADHD Son    Asthma Son    Colon cancer Neg Hx     Social History   Socioeconomic History   Marital status: Married    Spouse name: merisa   Number of children: 1   Years of education: Not on file   Highest education level: Not on file  Occupational History   Occupation: Psychologist, forensic  Tobacco Use   Smoking  status: Former    Current packs/day: 0.00    Types: Cigarettes    Quit date: 2020    Years since quitting: 4.9    Passive exposure: Past   Smokeless tobacco: Former    Types: Chew    Quit date: 2021  Vaping Use   Vaping status: Never Used  Substance and Sexual Activity   Alcohol use: Not Currently   Drug use: Yes    Types: Marijuana    Comment: few times a day   Sexual activity: Yes  Other Topics Concern   Not on file  Social History Narrative   Not on file   Social Determinants of Health   Financial Resource Strain: Not on file  Food Insecurity: Not on file  Transportation Needs: Not on file  Physical Activity: Not on file  Stress: Not on file  Social  Connections: Not on file  Intimate Partner Violence: Not on file     Review of Systems   Gen: Denies any fever, chills, fatigue, weight loss, lack of appetite.  CV: Denies chest pain, heart palpitations, peripheral edema, syncope.  Resp: Denies shortness of breath at rest or with exertion. Denies wheezing or cough.  GI: Denies dysphagia or odynophagia. Denies jaundice, hematemesis, fecal incontinence. GU : Denies urinary burning, urinary frequency, urinary hesitancy MS: Denies joint pain, muscle weakness, cramps, or limitation of movement.  Derm: Denies rash, itching, dry skin Psych: Denies depression, anxiety, memory loss, and confusion Heme: Denies bruising, bleeding, and enlarged lymph nodes.   Physical Exam   BP (!) 140/87 (BP Location: Right Arm, Patient Position: Sitting, Cuff Size: Normal)   Pulse 65   Temp 98.2 F (36.8 C) (Temporal)   Ht 6' (1.829 m)   Wt 188 lb 12.8 oz (85.6 kg)   BMI 25.61 kg/m  General:   Alert and oriented. Pleasant and cooperative. Well-nourished and well-developed.  Head:  Normocephalic and atraumatic. Eyes:  Without icterus Abdomen:  +BS, soft, non-tender and non-distended. No HSM noted. No guarding or rebound. No masses appreciated.  Rectal:  Deferred  Msk:  Symmetrical without gross deformities. Normal posture. Extremities:  Without edema. Neurologic:  Alert and  oriented x4;  grossly normal neurologically. Skin:  Intact without significant lesions or rashes. Psych:  Alert and cooperative. Normal mood and affect.   Assessment   George Newton is a 47 y.o. male presenting today with a history of GERD, chronic N/V, H.pylori gastritis s/p treatment with documented eradication, now needing routine screening colonoscopy.  FIrst attempt at colonoscopy unsuccessful as he had vomiting with Trilyte. We will use Miralax prep now.  Interestingly, he notes improvement in chronic N/V with avoidance of majority of meats. I will check alpha gal panel as  he does have a history of tick bite exposure. Overall, he is doing well now with PPI.     PLAN    Proceed with colonoscopy by Dr. Levon Hedger in near future: the risks, benefits, and alternatives have been discussed with the patient in detail. The patient states understanding and desires to proceed.  Miralax prep Alpha gal panel Continue PPI   Gelene Mink, PhD, [facility]

## 2023-06-25 NOTE — Patient Instructions (Signed)
We are arranging a colonoscopy with Dr. Levon Hedger in the near future!  You can have blood work done when you are able.  Have a great Christmas!  I enjoyed seeing you again today! I value our relationship and want to provide genuine, compassionate, and quality care. You may receive a survey regarding your visit with me, and I welcome your feedback! Thanks so much for taking the time to complete this. I look forward to seeing you again.      Gelene Mink, PhD, ANP-BC [facility]

## 2023-06-26 NOTE — Telephone Encounter (Signed)
Attempted to call pt, vm is not set up.

## 2023-06-27 LAB — ALPHA-GAL PANEL
Allergen Lamb IgE: <0.1 kU/L
Beef IgE: <0.1 kU/L
IgE (Immunoglobulin E), Serum: 115 [IU]/mL (ref 6–495)
O215-IgE Alpha-Gal: <0.1 kU/L
Pork IgE: <0.1 kU/L

## 2023-07-01 ENCOUNTER — Encounter: Payer: Self-pay | Admitting: *Deleted

## 2023-07-01 NOTE — Telephone Encounter (Signed)
Attempted to call pt to schedule him for procedure. Vm not set up. Will send mychart message.

## 2023-07-23 NOTE — Telephone Encounter (Signed)
 Pt says he is not sure he will be able to go a whole day without eating. He says he is going to give it a try. If he needs to reschedule, he will let us know.

## 2023-07-29 ENCOUNTER — Ambulatory Visit (HOSPITAL_COMMUNITY): Admission: RE | Admit: 2023-07-29 | Payer: Self-pay | Source: Home / Self Care | Admitting: Gastroenterology

## 2023-07-29 ENCOUNTER — Encounter (HOSPITAL_COMMUNITY): Admission: RE | Payer: Self-pay | Source: Home / Self Care

## 2023-07-29 ENCOUNTER — Encounter (INDEPENDENT_AMBULATORY_CARE_PROVIDER_SITE_OTHER): Payer: Self-pay

## 2023-07-29 ENCOUNTER — Encounter (HOSPITAL_COMMUNITY): Payer: Self-pay | Admitting: Anesthesiology

## 2023-07-29 SURGERY — COLONOSCOPY WITH PROPOFOL
Anesthesia: Monitor Anesthesia Care

## 2023-08-15 ENCOUNTER — Other Ambulatory Visit: Payer: Self-pay | Admitting: Gastroenterology

## 2023-08-15 DIAGNOSIS — K219 Gastro-esophageal reflux disease without esophagitis: Secondary | ICD-10-CM

## 2023-08-20 ENCOUNTER — Telehealth: Payer: Self-pay | Admitting: Gastroenterology

## 2023-08-20 DIAGNOSIS — Z1211 Encounter for screening for malignant neoplasm of colon: Secondary | ICD-10-CM

## 2023-08-20 DIAGNOSIS — K219 Gastro-esophageal reflux disease without esophagitis: Secondary | ICD-10-CM

## 2023-08-20 NOTE — Patient Instructions (Signed)
Please complete the Cologuard testing when you receive it in the mail.  We will see you in 6 months!  Congrats on the upcoming baby!   I enjoyed seeing you again today! I value our relationship and want to provide genuine, compassionate, and quality care. You may receive a survey regarding your visit with me, and I welcome your feedback! Thanks so much for taking the time to complete this. I look forward to seeing you again.      Gelene Mink, PhD, ANP-BC [facility]enter[facility]

## 2023-08-20 NOTE — Progress Notes (Signed)
Primary Care Physician:  Daryll Drown, NP  Primary GI: Dr. Levon Hedger   Patient Location: Home   Provider Location: [facility] office   Reason for Visit: Follow-up   Persons present on the virtual encounter, with roles:    Total time (minutes) spent on medical discussion: 8 minutes     Virtual Visit via MyChart Video Note  visit is conducted virtually and was requested by patient.   I connected with George Newton on 08/20/23 at  9:00 AM EST by video and verified that I am speaking with the correct person using two identifiers.   I discussed the limitations, risks, security and privacy concerns of performing an evaluation and management service by video and the availability of in person appointments. I also discussed with the patient that there may be a patient responsible charge related to this service. The patient expressed understanding and agreed to proceed.  Chief Complaint  Patient presents with   Gastroesophageal Reflux    Patient doing a mychart visit today. Follow up on GERD. Taking protonix bid. States he has zofran and phenergan as needed for nausea and vomiting.    Colon Cancer Screening    Would like to try to get colonoscopy or cologuard. Has had to cancel colonoscopy a few times in past due to sickness.      History of Present Illness:  48 y.o. male presenting today with a history of GERD, chronic N/V. Underwent EGD Nov 2023 with H.pylori gastritis, small amount of food in stomach, normal duodenum. Treated with Amoxicillin and clarithromycin. H.pylori breath test negative following treatment   He has attempted to complete routine screening colonoscopy several times but has had difficulty with prep to include nausea and vomiting. He becomes very nauseated if not eating for some period of time.   Alpha gal panel negative.    Still doing well. Feeling nauseated prior to prep. Been awhile since getting sick. Staying "on top" of the medication. Pantoprazole  BID. Changed diet. N/V much improved. Grilled chicken. Significant other is pregnant again. Carafate prn. Taking for sure in the mornings.   Interested in cologuard.   No FH colon cancer or polyps.     Past Medical History:  Diagnosis Date   GERD (gastroesophageal reflux disease)    History of kidney stones      Past Surgical History:  Procedure Laterality Date   APPENDECTOMY     BIOPSY  06/10/2022   Procedure: BIOPSY;  Surgeon: Lanelle Bal, DO;  [facility];  Service: Endoscopy;;   CYSTOSCOPY W/ URETERAL STENT PLACEMENT     ESOPHAGOGASTRODUODENOSCOPY (EGD) WITH PROPOFOL N/A 06/10/2022   H.pylori gastritis, small amount of food in stomach, normal duodenum   shoulder Left    SHOULDER SURGERY     left shoulder   SHOULDER SURGERY Left 2008     Current Meds  Medication Sig   acetaminophen (TYLENOL) 500 MG tablet Take 1 tablet (500 mg total) by mouth every 6 (six) hours as needed for moderate pain.   cetirizine (ZYRTEC) 10 MG tablet TAKE ONE TABLET AT BEDTIME   ondansetron (ZOFRAN-ODT) 8 MG disintegrating tablet 8mg  ODT q4 hours prn nausea   pantoprazole (PROTONIX) 40 MG tablet TAKE ONE TABLET TWICE DAILY BEFORE MEALS   promethazine (PHENERGAN) 25 MG tablet Take 1 tablet (25 mg total) by mouth every 6 (six) hours as needed for up to 6 doses for nausea or vomiting.   sucralfate (CARAFATE) 1 g tablet TAKE  ONE TABLET FOUR TIMES DAILY BEFORE MEALS AND AT BEDTIME     Family History  Problem Relation Age of Onset   Cancer Mother    Stroke Mother    Heart disease Father    Kidney disease Maternal Grandmother    ADD / ADHD Son    Asthma Son    Colon cancer Neg Hx     Social History   Socioeconomic History   Marital status: Married    Spouse name: merisa   Number of children: 1   Years of education: Not on file   Highest education level: Not on file  Occupational History   Occupation: Psychologist, forensic  Tobacco Use   Smoking status: Former     Current packs/day: 0.00    Types: Cigarettes    Quit date: 2020    Years since quitting: 5.0    Passive exposure: Past   Smokeless tobacco: Former    Types: Chew    Quit date: 2021  Vaping Use   Vaping status: Never Used  Substance and Sexual Activity   Alcohol use: Not Currently   Drug use: Yes    Types: Marijuana    Comment: few times a day   Sexual activity: Yes  Other Topics Concern   Not on file  Social History Narrative   Not on file   Social Drivers of Health   Financial Resource Strain: Not on file  Food Insecurity: Not on file  Transportation Needs: Not on file  Physical Activity: Not on file  Stress: Not on file  Social Connections: Not on file       Review of Systems: Gen: Denies fever, chills, anorexia. Denies fatigue, weakness, weight loss.  CV: Denies chest pain, palpitations, syncope, peripheral edema, and claudication. Resp: Denies dyspnea at rest, cough, wheezing, coughing up blood, and pleurisy. GI: see HPI Derm: Denies rash, itching, dry skin Psych: Denies depression, anxiety, memory loss, confusion. No homicidal or suicidal ideation.  Heme: Denies bruising, bleeding, and enlarged lymph nodes.  Observations/Objective: No distress. Unable to perform physical exam due to video encounter.   Assessment and Plan: 48 y.o. 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  GERD and N/V much improved with PPI and carafate prn. He has drastically changed his diet with excellent results. As of note, alphagal panel negative. EGD on file from past.   Colon cancer screening: failed several times with prep due to N/V. He is a good candidate for Cologuard, which we will do.   Follow Up Instructions: Cologuard 6 month follw-up No changes in meds   I discussed the assessment and treatment plan with the patient. The patient was provided an  opportunity to ask questions and all were answered. The patient agreed with the plan and demonstrated an understanding of the instructions.   The patient was advised to call back or seek an in-person evaluation if the symptoms worsen or if the condition fails to improve as anticipated.  I provided 8 minutes of face-to-face time during this MyChart Video encounter.  Gelene Mink, PhD, ANP-BC [facility]

## 2023-08-25 ENCOUNTER — Telehealth: Payer: Self-pay

## 2023-08-25 NOTE — Telephone Encounter (Signed)
George Newton,  I contacted the pt through his MyChart regarding his insurance. The pt states he does not have any he is in between insurances. So please advise regarding the cologuard paperwork

## 2023-08-25 NOTE — Telephone Encounter (Signed)
Hi Luisdavid,   Since you don't have insurance coverage right now, it would be a good idea to wait because if not you have to pay out of pocket and this test is over $500.00   Please let us know what you decide       Thank you Heloise Beecham, CMA

## 2023-08-25 NOTE — Telephone Encounter (Signed)
Is there patient assistance for Cologuard? If so, let's do that. If not, let him know we can't do the Cologuard unless has insurance or he wants to be self pay.

## 2023-09-01 ENCOUNTER — Telehealth: Payer: Self-pay

## 2023-09-01 NOTE — Telephone Encounter (Signed)
Pt LMOVM stating he didn't have enough service to MyChart [place] but he did leave a message stating he is having another episode of where he is having nausea and vomiting. Tobi Bastos do this pt need an ov or please advise.

## 2023-09-02 NOTE — Telephone Encounter (Signed)
Phoned and advised the pt of your instructions / recommendations. Pt states he has smoked in months, he has a beer every night. He hasn't been vomiting for the past 4 days. He states he is just nauseated and feels rough. I asked him has he been tested for other things he stated yes. He also states he thinks it is IBS. He is asking for a Rx for Zofran to be sent to [facility] in [place] (he is completely out). Please advise

## 2023-09-02 NOTE — Telephone Encounter (Signed)
Needs to avoid marijuana, use Zofran around the clock before meals and bedtime (four times a day), continue pantoprazole BID, cut back to clear liquids until symptoms improve and then gradually increase to soft foods. Let us know if persists longer than 24 hours.

## 2023-09-03 MED ORDER — ONDANSETRON 4 MG PO TBDP: 4.0000 mg | ORAL_TABLET | Freq: Three times a day (TID) | ORAL | 3 refills | Status: DC | PRN

## 2023-09-03 NOTE — Telephone Encounter (Signed)
[name]: I sent in prescription. Thanks!

## 2023-09-03 NOTE — Addendum Note (Signed)
Addended by: Gelene Mink on: 09/03/2023 02:12 PM   Modules accepted: Orders

## 2023-09-03 NOTE — Telephone Encounter (Signed)
Noted pt. advised

## 2023-09-21 MED ORDER — PANTOPRAZOLE SODIUM 40 MG PO TBEC: 40.0000 mg | DELAYED_RELEASE_TABLET | Freq: Two times a day (BID) | ORAL | 3 refills | Status: DC

## 2023-11-24 ENCOUNTER — Other Ambulatory Visit: Payer: Self-pay | Admitting: Gastroenterology

## 2023-11-24 DIAGNOSIS — K219 Gastro-esophageal reflux disease without esophagitis: Secondary | ICD-10-CM

## 2023-12-29 ENCOUNTER — Other Ambulatory Visit: Payer: Self-pay | Admitting: Gastroenterology

## 2023-12-29 DIAGNOSIS — K219 Gastro-esophageal reflux disease without esophagitis: Secondary | ICD-10-CM

## 2024-02-03 ENCOUNTER — Other Ambulatory Visit: Payer: Self-pay | Admitting: Gastroenterology

## 2024-02-03 DIAGNOSIS — K219 Gastro-esophageal reflux disease without esophagitis: Secondary | ICD-10-CM

## 2024-02-08 ENCOUNTER — Other Ambulatory Visit: Payer: Self-pay

## 2024-02-08 ENCOUNTER — Telehealth: Payer: Self-pay

## 2024-02-08 ENCOUNTER — Other Ambulatory Visit: Payer: Self-pay | Admitting: Gastroenterology

## 2024-02-08 DIAGNOSIS — R112 Nausea with vomiting, unspecified: Secondary | ICD-10-CM

## 2024-02-08 DIAGNOSIS — K219 Gastro-esophageal reflux disease without esophagitis: Secondary | ICD-10-CM

## 2024-02-08 MED ORDER — ONDANSETRON 4 MG PO TBDP: 4.0000 mg | ORAL_TABLET | Freq: Three times a day (TID) | ORAL | 3 refills | Status: DC | PRN

## 2024-02-08 NOTE — Telephone Encounter (Signed)
 Returned the pt's call and pt wanted a refill on his Zofran . Refilled pt's Zofran  with reills

## 2024-03-08 ENCOUNTER — Other Ambulatory Visit: Payer: Self-pay | Admitting: Gastroenterology

## 2024-03-08 DIAGNOSIS — K219 Gastro-esophageal reflux disease without esophagitis: Secondary | ICD-10-CM

## 2024-04-12 ENCOUNTER — Other Ambulatory Visit: Payer: Self-pay | Admitting: Gastroenterology

## 2024-04-12 DIAGNOSIS — K219 Gastro-esophageal reflux disease without esophagitis: Secondary | ICD-10-CM

## 2024-05-04 ENCOUNTER — Encounter (INDEPENDENT_AMBULATORY_CARE_PROVIDER_SITE_OTHER): Payer: Self-pay | Admitting: Gastroenterology

## 2024-05-18 ENCOUNTER — Other Ambulatory Visit: Payer: Self-pay | Admitting: Gastroenterology

## 2024-05-18 DIAGNOSIS — K219 Gastro-esophageal reflux disease without esophagitis: Secondary | ICD-10-CM

## 2024-05-25 ENCOUNTER — Other Ambulatory Visit: Payer: Self-pay

## 2024-05-25 DIAGNOSIS — K219 Gastro-esophageal reflux disease without esophagitis: Secondary | ICD-10-CM

## 2024-05-25 MED ORDER — SUCRALFATE 1 G PO TABS: ORAL_TABLET | ORAL | 1 refills | Status: AC

## 2024-05-30 ENCOUNTER — Other Ambulatory Visit: Payer: Self-pay | Admitting: Gastroenterology

## 2024-05-30 DIAGNOSIS — R112 Nausea with vomiting, unspecified: Secondary | ICD-10-CM

## 2024-05-30 DIAGNOSIS — K219 Gastro-esophageal reflux disease without esophagitis: Secondary | ICD-10-CM

## 2024-06-07 ENCOUNTER — Other Ambulatory Visit: Payer: Self-pay | Admitting: Gastroenterology

## 2024-06-07 ENCOUNTER — Telehealth: Payer: Self-pay | Admitting: *Deleted

## 2024-06-07 DIAGNOSIS — K219 Gastro-esophageal reflux disease without esophagitis: Secondary | ICD-10-CM

## 2024-06-07 DIAGNOSIS — R112 Nausea with vomiting, unspecified: Secondary | ICD-10-CM

## 2024-06-07 MED ORDER — ONDANSETRON 4 MG PO TBDP: 4.0000 mg | ORAL_TABLET | Freq: Three times a day (TID) | ORAL | 3 refills | Status: AC | PRN

## 2024-06-07 NOTE — Telephone Encounter (Signed)
 Noted

## 2024-06-07 NOTE — Telephone Encounter (Signed)
 I am fine refilling with this. We just need to make sure that he has an upcoming appointment. I have sent refills.   Ladonna: please put nonurgently on schedule.

## 2024-06-07 NOTE — Telephone Encounter (Signed)
 I truly think she called it in but I need a clinical to look and send back in

## 2024-06-07 NOTE — Telephone Encounter (Signed)
 [facility] called 11/17 and left a message on [name] phone --they said that they had faxed a request for generic Zofran  on 11/11 and that it came back to them as failed when they sent again.  They need to know if we are going to refill this for him or not ---Reda looks like she took a message and said she was going to ask you on 11/13--can you assist with this?--looks like last visit was 08/20/23

## 2024-06-07 NOTE — Telephone Encounter (Signed)
 noted

## 2024-06-07 NOTE — Telephone Encounter (Signed)
 [facility] called and said they had sent on 11/11 a script refill for generic Zofran  4 mg and then resent and it came back denied going.

## 2024-06-07 NOTE — Telephone Encounter (Signed)
 After reviewing the pt's chart it does not appear that the refill got sent in. [name] sent a message to Therisa to see if refills could be authorized due to pt not following up per Anna's request.

## 2024-08-09 ENCOUNTER — Other Ambulatory Visit: Payer: Self-pay | Admitting: Gastroenterology

## 2024-08-09 DIAGNOSIS — K219 Gastro-esophageal reflux disease without esophagitis: Secondary | ICD-10-CM

## 2024-08-11 ENCOUNTER — Ambulatory Visit: Payer: Self-pay | Admitting: Gastroenterology

## 2024-08-11 ENCOUNTER — Encounter: Payer: Self-pay | Admitting: Gastroenterology
